# Patient Record
Sex: Female | Born: 1945 | Race: White | Hispanic: No | Smoking: Never smoker
Health system: Southern US, Community
[De-identification: ages and names within clinical notes are randomized; demographics above are authoritative.]

## PROBLEM LIST (undated history)

## (undated) DIAGNOSIS — I10 Essential (primary) hypertension: Secondary | ICD-10-CM

## (undated) DIAGNOSIS — E875 Hyperkalemia: Secondary | ICD-10-CM

## (undated) DIAGNOSIS — E0590 Thyrotoxicosis, unspecified without thyrotoxic crisis or storm: Secondary | ICD-10-CM

## (undated) DIAGNOSIS — K7689 Other specified diseases of liver: Secondary | ICD-10-CM

## (undated) HISTORY — PX: OTHER SURGICAL HISTORY: SHX169

## (undated) MED ORDER — HYDROCODONE-ACETAMINOPHEN 5 MG-325 MG TAB: 5-325 mg | ORAL_TABLET | ORAL | Status: AC | PRN

---

## 2012-04-27 ENCOUNTER — Inpatient Hospital Stay
Admit: 2012-04-27 | Discharge: 2012-05-05 | Disposition: A | Discharge status: Home / Self Care | Payer: MEDICARE | Attending: Surgical Oncology | Admitting: Surgical Oncology

## 2012-04-27 DIAGNOSIS — K7689 Other specified diseases of liver: Secondary | ICD-10-CM

## 2012-04-27 LAB — URINALYSIS W/ REFLEX CULTURE
Bilirubin: NEGATIVE
Glucose: NEGATIVE MG/DL
Ketone: NEGATIVE MG/DL
Nitrites: NEGATIVE
Protein: NEGATIVE MG/DL
Specific gravity: 1.018 (ref 1.003–1.030)
Urobilinogen: 4 EU/DL — ABNORMAL HIGH (ref 0.2–1.0)
pH (UA): 7 (ref 5.0–8.0)

## 2012-04-27 LAB — CBC WITH AUTOMATED DIFF
ABS. BASOPHILS: 0 10*3/uL (ref 0.0–0.1)
ABS. EOSINOPHILS: 0 10*3/uL (ref 0.0–0.4)
ABS. LYMPHOCYTES: 2.1 10*3/uL (ref 0.8–3.5)
ABS. MONOCYTES: 1.6 10*3/uL — ABNORMAL HIGH (ref 0.0–1.0)
ABS. NEUTROPHILS: 8.8 10*3/uL — ABNORMAL HIGH (ref 1.8–8.0)
BASOPHILS: 0 % (ref 0–1)
EOSINOPHILS: 0 % (ref 0–7)
HCT: 37.7 % (ref 35.0–47.0)
HGB: 12.2 g/dL (ref 11.5–16.0)
LYMPHOCYTES: 17 % (ref 12–49)
MCH: 26.6 PG (ref 26.0–34.0)
MCHC: 32.4 g/dL (ref 30.0–36.5)
MCV: 82.1 FL (ref 80.0–99.0)
MONOCYTES: 13 % (ref 5–13)
NEUTROPHILS: 70 % (ref 32–75)
PLATELET: 323 10*3/uL (ref 150–400)
RBC: 4.59 M/uL (ref 3.80–5.20)
RDW: 14 % (ref 11.5–14.5)
WBC: 12.5 10*3/uL — ABNORMAL HIGH (ref 3.6–11.0)

## 2012-04-27 LAB — METABOLIC PANEL, COMPREHENSIVE
A-G Ratio: 0.7 — ABNORMAL LOW (ref 1.1–2.2)
ALT (SGPT): 36 U/L (ref 12–78)
AST (SGOT): 24 U/L (ref 15–37)
Albumin: 3.5 g/dL (ref 3.5–5.0)
Alk. phosphatase: 143 U/L — ABNORMAL HIGH (ref 50–136)
Anion gap: 7 mmol/L (ref 5–15)
BUN/Creatinine ratio: 28 — ABNORMAL HIGH (ref 12–20)
BUN: 20 MG/DL (ref 6–20)
Bilirubin, total: 1 MG/DL (ref 0.2–1.0)
CO2: 27 MMOL/L (ref 21–32)
Calcium: 9.3 MG/DL (ref 8.5–10.1)
Chloride: 102 MMOL/L (ref 97–108)
Creatinine: 0.71 MG/DL (ref 0.45–1.15)
GFR est AA: >60 mL/min/{1.73_m2} (ref >60)
GFR est non-AA: >60 mL/min/{1.73_m2} (ref >60)
Globulin: 5.1 g/dL — ABNORMAL HIGH (ref 2.0–4.0)
Glucose: 131 MG/DL — ABNORMAL HIGH (ref 65–100)
Potassium: 3.6 MMOL/L (ref 3.5–5.1)
Protein, total: 8.6 g/dL — ABNORMAL HIGH (ref 6.4–8.2)
Sodium: 136 MMOL/L (ref 136–145)

## 2012-04-27 LAB — TROPONIN I: Troponin-I, Qt.: <0.04 ng/mL (ref <0.05)

## 2012-04-27 LAB — LIPASE: Lipase: 82 U/L (ref 73–393)

## 2012-04-27 LAB — CK W/ REFLX CKMB: CK: 34 U/L (ref 26–192)

## 2012-04-27 MED ADMIN — ondansetron (ZOFRAN) injection 4 mg: INTRAVENOUS | @ 21:00:00 | NDC 00781301072

## 2012-04-27 MED ADMIN — sodium chloride (NS) flush 10 mL: INTRAVENOUS | @ 18:00:00 | NDC 87701099893

## 2012-04-27 MED ADMIN — ondansetron (ZOFRAN) injection 4 mg: INTRAVENOUS | @ 16:00:00 | NDC 00781301072

## 2012-04-27 MED ADMIN — HYDROmorphone (PF) (DILAUDID) injection 1 mg: INTRAVENOUS | @ 17:00:00 | NDC 00409255201

## 2012-04-27 MED ADMIN — HYDROmorphone (PF) (DILAUDID) injection 1 mg: INTRAVENOUS | @ 21:00:00 | NDC 00409255201

## 2012-04-27 MED ADMIN — sodium chloride 0.9 % bolus infusion 1,000 mL: INTRAVENOUS | @ 16:00:00 | NDC 00409798309

## 2012-04-27 MED ADMIN — ioversol (OPTIRAY) 350 mg iodine/mL contrast solution 100 mL: INTRAVENOUS | @ 18:00:00 | NDC 00019133311

## 2012-04-27 MED ADMIN — sodium chloride 0.9 % bolus infusion 100 mL: INTRAVENOUS | @ 18:00:00 | NDC 00409798437

## 2012-04-27 MED FILL — ONDANSETRON (PF) 4 MG/2 ML INJECTION: 4 mg/2 mL | INTRAMUSCULAR | Qty: 2

## 2012-04-27 MED FILL — HYDROMORPHONE (PF) 1 MG/ML IJ SOLN: 1 mg/mL | INTRAMUSCULAR | Qty: 1

## 2012-04-27 MED FILL — BD POSIFLUSH NORMAL SALINE 0.9 % INJECTION SYRINGE: INTRAMUSCULAR | Qty: 10

## 2012-04-27 MED FILL — OPTIRAY 350 MG IODINE/ML INTRAVENOUS SOLUTION: 350 mg iodine/mL | INTRAVENOUS | Qty: 100

## 2012-04-27 MED FILL — SODIUM CHLORIDE 0.9 % IV: INTRAVENOUS | Qty: 100

## 2012-04-27 MED FILL — SODIUM CHLORIDE 0.9 % IV: INTRAVENOUS | Qty: 1000

## 2012-04-27 NOTE — ED Notes (Signed)
Patient in room with spouse. Complaining of pain in chest and abdomen. Xray here to pick up patient for CXR.

## 2012-04-27 NOTE — ED Provider Notes (Addendum)
HPI Comments: 66 y.o.female with pmh of HTN presents to the ED complaining of left sided chest, epigastric, and RUQ pain with nausea.  Pt states symptom onset began four days ago that is mild when she is laying still but increases in intensity with movement and food intake.  The pain doesn't radiate into her back and describes it as a dull with intermittent shooting pains, currently rating it 3/10 intensity.  She admits she has had decreased appetite and fluid intake since symptom onset with 2-3 episodes of vomiting yesterday after eating rice.  This morning she was able to eat dry toast without complications.  She hasn't had a BM in four days and denies any prior abdominal surgeries, fever, headache,current nausea, lower abdominal pain, leg pain, dysuria, increased urinary frequency, or any other acute medical problems.    Social hx: Non-smoker, no ETOH    Note written by Sherrye Payor, Scribe, as dictated by Shirl Harris, MD 11:54 AM            The history is provided by the patient.        Past Medical History   Diagnosis Date   ??? Hypertension         Past Surgical History   Procedure Laterality Date   ??? Hx gyn       Hysteroscopy, fibroid removal         History reviewed. No pertinent family history.     History     Social History   ??? Marital Status: MARRIED     Spouse Name: N/A     Number of Children: N/A   ??? Years of Education: N/A     Occupational History   ??? Not on file.     Social History Main Topics   ??? Smoking status: Never Smoker    ??? Smokeless tobacco: Not on file   ??? Alcohol Use: No   ??? Drug Use: No   ??? Sexually Active: Not on file     Other Topics Concern   ??? Not on file     Social History Narrative   ??? No narrative on file                  ALLERGIES: Review of patient's allergies indicates no known allergies.      Review of Systems   Constitutional: Positive for appetite change (Decreased). Negative for fever.   HENT: Negative for neck stiffness.    Eyes: Negative for visual disturbance.    Respiratory: Negative for cough, shortness of breath and wheezing.    Cardiovascular: Positive for chest pain. Negative for leg swelling.   Gastrointestinal: Positive for nausea, vomiting and abdominal pain. Negative for diarrhea.   Genitourinary: Negative for dysuria.   Musculoskeletal: Negative.  Negative for back pain.   Skin: Negative for rash.   Neurological: Negative.  Negative for syncope and headaches.   Psychiatric/Behavioral: Negative for confusion.   All other systems reviewed and are negative.        Filed Vitals:    04/27/12 1118 04/27/12 1233 04/27/12 1245   BP: 115/73  117/50   Pulse: 93  77   Temp: 98.7 ??F (37.1 ??C)     Resp: 16 19 12    Height: 5' 4.5" (1.638 m)     Weight: 90.719 kg (200 lb)     SpO2: 95% 97% 93%            Physical Exam   Nursing note and vitals reviewed.  Constitutional: She is oriented to person, place, and time. She appears well-developed and well-nourished. No distress.   HENT:   Head: Normocephalic and atraumatic.   Eyes: Conjunctivae are normal. No scleral icterus.   Neck: Neck supple. No tracheal deviation present.   Cardiovascular: Normal rate, regular rhythm, normal heart sounds and intact distal pulses.  Exam reveals no gallop and no friction rub.    No murmur heard.  Pulmonary/Chest: Effort normal and breath sounds normal. She has no wheezes. She has no rales.   Abdominal: Soft. She exhibits no distension. There is tenderness. There is no rebound and no guarding.   Tenderness over the epigastrium and RUQ with postive Murphy's sign.  No lower abdominal pain.     Musculoskeletal: Normal range of motion. She exhibits no edema and no tenderness.   Neurological: She is alert and oriented to person, place, and time.   Skin: Skin is warm and dry. No rash noted.   Psychiatric: She has a normal mood and affect. Her behavior is normal.        MDM     Differential Diagnosis; Clinical Impression; Plan:     66 year old white female presents to the emergency department with right  upper quadrant abdominal pain, epigastric abdominal pain, vomiting. Pain has been present for 3 or 4 days. Pain is worse with eating. Patient is tender in her epigastrium and right upper quadrant. I suspect either gastritis or gallbladder disease. Will check blood work, ultrasound of the gallbladder. We'll give IV fluids, Zofran. We'll reassess shortly.  Amount and/or Complexity of Data Reviewed:   Clinical lab tests:  Ordered and reviewed  Tests in the radiology section of CPT??:  Ordered and reviewed  Tests in the medicine section of the CPT??:  Ordered and reviewed   Decide to obtain previous medical records or to obtain history from someone other than the patient:  Yes   Obtain history from someone other than the patient:  Yes   Review and summarize past medical records:  Yes   Independant visualization of image, tracing, or specimen:  Yes  Risk of Significant Complications, Morbidity, and/or Mortality:   Presenting problems:  High  Diagnostic procedures:  High  Management options:  High  Progress:   Patient progress:  Improved      Procedures    ED EKG interpretation:  Rhythm: normal sinus rhythm; and regular . Rate (approx.): 88; Axis: left axis deviation; P wave: normal; QRS interval: normal ; ST/T wave: normal; Other findings: left ventricular hypertrophy. This EKG was interpreted by Clarene Reamer Provider.  Note written by Shirl Harris, MD, Scribe, as dictated by Shirl Harris, MD 11:54 AM      Labs:  WBC slightly elevated at 12.4  UA negative  LFTs and lipase negative    1:44 PM  [place] tech states that pt has heterogeneous appearance to liver with cystic structure. Will order CT abd/pelvis to evaluate.  Shirl Harris, MD    1500   Care turned over to Dr Mayford Knife. CT abd pending. Disposition based on CT abd results. It appears on [place] of abd that pt has large cystic liver mass. I suspect pt will need admission.    CONSULT NOTE:  Arbutus Ped, MD spoke to Philipp Ovens, MD, general surgery, concerning the  patient. The patient's history, presentation, physical findings, and results were all discussed.   Note written by Mariella Saa, Scribe, as dictated by Arbutus Ped, MD 5:40 PM  5:51 PM this patient still has significant pain in the right upper quadrant.  The painiHas been progressive in a crescendo fashion of the last 4 days. I will admit this patient to the hospital this and Dr. Noel Gerold will see this patient in the a.m.

## 2012-04-27 NOTE — H&P (Signed)
Name:       Sandra Walsh, Sandra Walsh                  Admitted:    04/27/2012    Account #:  [id number]                     DOB:         1946/01/12  Physician:  Corene Cornea, MD          Age:         66                               HISTORY AND PHYSICAL          PRIMARY CARE PHYSICIAN: Kathe Becton, MD.    SOURCE OF INFORMATION: The patient and the patient's spouse who was present  at the bedside.    CHIEF COMPLAINT: Chest pain.    HISTORY OF PRESENT ILLNESS: This is a 66 year old woman with a past medical  history significant for hypertension, hyperthyroidism, who was in her usual  state of health until a couple of days ago when the patient started  experiencing abdominal pain.  The pain is located at the epigastric area  with radiation to both the left flank as well as the right flank.  It was  associated with nausea and decreased oral intake. The patient also stated  that the pain radiates to her back. She came to the emergency room today  because of her worsening symptoms. When the patient arrived in the  emergency room, she had a CT scan of the abdomen done as well as the  abdominal ultrasound that shows a hepatic cyst. The emergency room  physician discussed the patient with the general surgeon on-call, who  advised that the patient be admitted to the hospitalist service and a  formal surgical consultation be carried out the following morning. The  patient was referred to the hospitalist service for that purpose. The  patient has no fever, no history of recent travel outside the country.    PAST MEDICAL HISTORY: Hypertension, hypothyroidism.    ALLERGIES: NO KNOWN DRUG ALLERGIES.    MEDICATIONS  1. Atenolol 25 mg daily.  2. Lisinopril/hydrochlorothiazide 20/25 one tablet daily.  3. Tapazole 5 mg daily.    FAMILY HISTORY: Family history was reviewed with the patient. No family  history of liver disease.    PAST SURGICAL HISTORY: This is significant for gynecological surgery.    SOCIAL HISTORY: No history of  alcohol or tobacco abuse.    REVIEW OF SYSTEMS  HEAD, EYES, EARS, NOSE AND THROAT: No headache, no dizziness, no blurring  of vision, no photophobia.  RESPIRATORY: No cough, no shortness of breath, no hemoptysis.  CARDIOVASCULAR: This is positive for chest pain, no orthopnea, no  palpitation.  GASTROINTESTINAL: This is positive for nausea and vomiting and abdominal  pain. No diarrhea, no constipation.  GENITOURINARY: No dysuria, no urgency, and no frequency. All other systems  are reviewed and they are negative.    PHYSICAL EXAMINATION  GENERAL APPEARANCE: The patient appeared ill, in moderate distress.  VITAL SIGNS: On arrival at the emergency room, temperature 98.7, pulse 93,  respiratory rate 16. Blood pressure 115/73, oxygen saturation 95% on room  air. HEAD, EYES, EARS, NOSE AND THROAT:  Normocephalic, atraumatic.  NECK: Supple. No JVD. No carotid bruits. Pupils equal and reactive to  light. No neck masses.  CHEST: Clear breath sounds. No wheezing, no crackles.  HEART: Normal S1 and S2, regular. No clinically appreciable murmur.  ABDOMEN: Diffuse tenderness, no rebound tenderness, no guarding. Normal  bowel sounds.  CENTRAL NERVOUS SYSTEM:  Alert, oriented x3. No gross focal neurological  deficits.  EXTREMITIES: No edema. Pulses 2+ bilaterally.    DIAGNOSTIC DATA: CT scan of the abdomen and pelvis with contrast shows  hepatic cyst, nonobstructing bilateral nephrolithiasis.  Abdominal  ultrasound shows a contracted gallbladder. No evidence of acute  cholecystitis.  Hepatic cyst containing several septations noted.  Mild  left hydronephrosis. Chest x-ray shows trace bilateral pleural effusions  and bibasilar linear atelectasis.    LABORATORY DATA: Urinalysis:  This is significant for negative nitrite,  large leukocyte esterase, 1+ bacteria. Lipase level 82.  CHEMISTRY: Sodium 136, potassium 3.6, chloride 102, CO2 27, glucose 131,  BUN 20, creatinine 0.71, calcium 9.3, bilirubin total 1.0, ALT 36, AST 24,   alkaline phosphatase 143, total protein 8.6, albumin level 3.5, globulin  5.1. Cardiac profile: CK 34, troponin less than 0.04. Hematology: WBC 12.5,  hemoglobin 12.2, hematocrit 37.7, platelets at 323.    ASSESSMENT  1. Hepatic cyst.  2. Hypertension.  3. Hypothyroidism.  4. Urinary tract infection.  5. Hyperglycemia.  6. Leukocytosis.  7. Abnormal chest x-ray.    PLAN  1. Hepatic cysts.  Will admit the patient for further evaluation. General  surgery consult has already been requested. We will await further  recommendations from the general surgeon.  We will obtain an ESR and  C-reactive protein level.  2. Hypertension. We will resume her preadmission antihypertensive  medication and monitor the patient's blood pressure closely.  3. Hypothyroidism. We will continue with Synthroid and check a TSH level.  4. Urinary tract infection. We will start the patient on Levaquin. I will  obtain a urine culture.  I will check an ESR and C-reactive protein level.  I will also obtain a lactic acid level.  5. Hyperglycemia.  The patient has no history of diabetes. Will check  hemoglobin A1c level.  6. Leukocytosis. This could be due to the urinary tract infection.  As  stated above, will obtain a lactic acid level, ESR, and C-reactive protein  level.  7. Abnormal chest x-ray. I will obtain a CT scan of the chest for further  evaluation of the abnormal chest x-ray.    OTHER ISSUES    CODE STATUS: THE PATIENT IS A FULL CODE. WE WILL PLACE THE PATIENT ON  LOVENOX FOR DVT PROPHYLAXIS.                Corene Cornea, MD    cc:                       Orma Flaming, MD                            Corene Cornea, MD      RAE/wmx; D: 04/27/2012 07:56 P; T: 04/27/2012 08:37 P; DOC# [id number]; Job#  [id number]

## 2012-04-27 NOTE — Progress Notes (Signed)
Admission Medication Reconciliation:    Information obtained from: patient    Significant PMH/Disease States:   Past Medical History   Diagnosis Date   ??? Hypertension        Chief Complaint for this Admission:  Chest pain/nausea    Allergies:  Review of patient's allergies indicates no known allergies.    Prior to Admission Medications:   Prior to Admission Medications   Medication Last Dose Informant Patient Reported? Taking?   methimazole (TAPAZOLE) 5 mg tablet 04/26/2012 at Unknown  Yes Yes   Take 5 mg by mouth nightly.   atenolol (TENORMIN) 25 mg tablet 04/26/2012 at Unknown  Yes Yes   Take 25 mg by mouth nightly.   lisinopril-hydrochlorothiazide (PRINZIDE, ZESTORETIC) 20-25 mg per tablet 04/27/2012 at Unknown  Yes Yes   Take  by mouth daily.          Comments/Recommendations: Changed timing of atenolol and methimazole to hs per patient report

## 2012-04-27 NOTE — H&P (Signed)
H&P dictated ZOX#[id number]

## 2012-04-27 NOTE — ED Notes (Signed)
Patient lying in bed with daughter at bedside. Patient states her pain has been under control and her chest/abdomen only really hurt when she moves or breathes deeply.

## 2012-04-27 NOTE — ED Notes (Signed)
Pt. C/o chest pain 6/10 that radiates to left upper abdominal area. Has been going on for past four days. Patient states that before today she has not been able to keep any food down because of resulting nausea and vomiting from the pain. Today she had toast that she tolerated well.

## 2012-04-27 NOTE — ED Notes (Addendum)
Student was appropriately supervised during medication administration by primary Registered Nurse.    MELISSA A PHILLIPS, RN

## 2012-04-27 NOTE — ED Notes (Signed)
Student was appropriately supervised during medication administration by primary Registered Nurse.    MELISSA A PHILLIPS, RN

## 2012-04-27 NOTE — ED Notes (Signed)
Patient states that she "doesn't feel any pain at all right now". Ambulated safely to and from restroom.

## 2012-04-27 NOTE — ED Notes (Signed)
"  I have chest pain and nausea. I threw up yesterday, I can't eat anything. I had some dry toast this morning. It's just a dull pain in my chest and sometimes it's shooting. I break out in a sweat and have chills once in a while." Patient has hx of irregular heartbeat but denies any other cardiac hx.

## 2012-04-28 LAB — METABOLIC PANEL, COMPREHENSIVE
A-G Ratio: 0.6 — ABNORMAL LOW (ref 1.1–2.2)
ALT (SGPT): 29 U/L (ref 12–78)
AST (SGOT): 33 U/L (ref 15–37)
Albumin: 2.7 g/dL — ABNORMAL LOW (ref 3.5–5.0)
Alk. phosphatase: 125 U/L (ref 50–136)
Anion gap: 9 mmol/L (ref 5–15)
BUN/Creatinine ratio: 43 — ABNORMAL HIGH (ref 12–20)
BUN: 20 MG/DL (ref 6–20)
Bilirubin, total: 0.9 MG/DL (ref 0.2–1.0)
CO2: 27 MMOL/L (ref 21–32)
Calcium: 8.7 MG/DL (ref 8.5–10.1)
Chloride: 103 MMOL/L (ref 97–108)
Creatinine: 0.47 MG/DL (ref 0.45–1.15)
GFR est AA: >60 mL/min/{1.73_m2} (ref >60)
GFR est non-AA: >60 mL/min/{1.73_m2} (ref >60)
Globulin: 4.5 g/dL — ABNORMAL HIGH (ref 2.0–4.0)
Glucose: 97 MG/DL (ref 65–100)
Potassium: 4.1 MMOL/L (ref 3.5–5.1)
Protein, total: 7.2 g/dL (ref 6.4–8.2)
Sodium: 139 MMOL/L (ref 136–145)

## 2012-04-28 LAB — EKG, 12 LEAD, INITIAL
Atrial Rate: 88 {beats}/min
Calculated P Axis: 5 degrees
Calculated R Axis: -23 degrees
Calculated T Axis: 56 degrees
Diagnosis: NORMAL
P-R Interval: 144 ms
Q-T Interval: 350 ms
QRS Duration: 86 ms
QTC Calculation (Bezet): 423 ms
Ventricular Rate: 88 {beats}/min

## 2012-04-28 LAB — CK W/ CKMB & INDEX
CK - MB: <0.5 NG/ML — ABNORMAL LOW (ref 0.5–3.6)
CK - MB: <0.5 NG/ML — ABNORMAL LOW (ref 0.5–3.6)
CK: 24 U/L — ABNORMAL LOW (ref 26–192)
CK: 71 U/L (ref 26–192)

## 2012-04-28 LAB — CBC WITH AUTOMATED DIFF
ABS. BASOPHILS: 0 10*3/uL (ref 0.0–0.1)
ABS. EOSINOPHILS: 0 10*3/uL (ref 0.0–0.4)
ABS. LYMPHOCYTES: 1.8 10*3/uL (ref 0.8–3.5)
ABS. MONOCYTES: 1.3 10*3/uL — ABNORMAL HIGH (ref 0.0–1.0)
ABS. NEUTROPHILS: 5.8 10*3/uL (ref 1.8–8.0)
BASOPHILS: 0 % (ref 0–1)
EOSINOPHILS: 0 % (ref 0–7)
HCT: 31.7 % — ABNORMAL LOW (ref 35.0–47.0)
HGB: 10.2 g/dL — ABNORMAL LOW (ref 11.5–16.0)
LYMPHOCYTES: 20 % (ref 12–49)
MCH: 26.4 PG (ref 26.0–34.0)
MCHC: 32.2 g/dL (ref 30.0–36.5)
MCV: 82.1 FL (ref 80.0–99.0)
MONOCYTES: 15 % — ABNORMAL HIGH (ref 5–13)
NEUTROPHILS: 65 % (ref 32–75)
PLATELET: 271 10*3/uL (ref 150–400)
RBC: 3.86 M/uL (ref 3.80–5.20)
RDW: 14.1 % (ref 11.5–14.5)
WBC: 8.9 10*3/uL (ref 3.6–11.0)

## 2012-04-28 LAB — BNP: BNP: 30 pg/mL (ref 0–100)

## 2012-04-28 LAB — SED RATE (ESR): Sed rate (ESR): 85 MM/HR — ABNORMAL HIGH (ref 0–30)

## 2012-04-28 LAB — MAGNESIUM: Magnesium: 1.7 MG/DL (ref 1.6–2.4)

## 2012-04-28 LAB — C REACTIVE PROTEIN, QT: C-Reactive protein: 17.3 mg/dL — ABNORMAL HIGH (ref 0.00–0.60)

## 2012-04-28 LAB — PHOSPHORUS: Phosphorus: 3.7 MG/DL (ref 2.5–4.9)

## 2012-04-28 LAB — TROPONIN I
Troponin-I, Qt.: <0.04 ng/mL (ref <0.05)
Troponin-I, Qt.: <0.04 ng/mL (ref <0.05)

## 2012-04-28 LAB — LACTIC ACID: Lactic acid: 0.9 MMOL/L (ref 0.4–2.0)

## 2012-04-28 LAB — HEMOGLOBIN A1C WITH EAG
Est. average glucose: 131 mg/dL
Hemoglobin A1c: 6.2 % (ref 4.2–6.3)

## 2012-04-28 LAB — TSH 3RD GENERATION: TSH: <0.01 u[IU]/mL — ABNORMAL LOW (ref 0.36–3.74)

## 2012-04-28 MED ADMIN — 0.9% sodium chloride infusion: INTRAVENOUS | @ 05:00:00 | NDC 00409798309

## 2012-04-28 MED ADMIN — 0.9% sodium chloride infusion: INTRAVENOUS | @ 02:00:00 | NDC 00409798309

## 2012-04-28 MED ADMIN — lactobac ac& pc-s.therm-b.anim (FLORA Q): ORAL | @ 20:00:00 | NDC 31257056030

## 2012-04-28 MED ADMIN — HYDROmorphone (PF) (DILAUDID) injection 1 mg: INTRAVENOUS | @ 11:00:00 | NDC 00409255201

## 2012-04-28 MED ADMIN — docusate sodium (COLACE) capsule 100 mg: ORAL | @ 02:00:00 | NDC 62584068311

## 2012-04-28 MED ADMIN — ondansetron (ZOFRAN) injection 4 mg: INTRAVENOUS | @ 16:00:00 | NDC 00409475503

## 2012-04-28 MED ADMIN — levofloxacin (LEVAQUIN) 500 mg in D5W IVPB: INTRAVENOUS | @ 02:00:00 | NDC 25021013282

## 2012-04-28 MED ADMIN — HYDROmorphone (PF) (DILAUDID) injection 1 mg: INTRAVENOUS | @ 16:00:00 | NDC 00409255201

## 2012-04-28 MED ADMIN — pantoprazole (PROTONIX) injection 40 mg: INTRAVENOUS | @ 13:00:00 | NDC 63323018610

## 2012-04-28 MED ADMIN — methimazole (TAPAZOLE) tablet 5 mg: ORAL | @ 02:00:00 | NDC 68084027511

## 2012-04-28 MED ADMIN — ondansetron (ZOFRAN) injection 4 mg: INTRAVENOUS | @ 12:00:00 | NDC 00781301072

## 2012-04-28 MED FILL — DOCUSATE SODIUM 100 MG CAP: 100 mg | ORAL | Qty: 1

## 2012-04-28 MED FILL — BD POSIFLUSH NORMAL SALINE 0.9 % INJECTION SYRINGE: INTRAMUSCULAR | Qty: 10

## 2012-04-28 MED FILL — BISACODYL 5 MG TAB, DELAYED RELEASE: 5 mg | ORAL | Qty: 1

## 2012-04-28 MED FILL — SODIUM CHLORIDE 0.9 % IV: INTRAVENOUS | Qty: 100

## 2012-04-28 MED FILL — HYDROCHLOROTHIAZIDE 25 MG TAB: 25 mg | ORAL | Qty: 1

## 2012-04-28 MED FILL — HYDROMORPHONE (PF) 1 MG/ML IJ SOLN: 1 mg/mL | INTRAMUSCULAR | Qty: 1

## 2012-04-28 MED FILL — LISINOPRIL 10 MG TAB: 10 mg | ORAL | Qty: 2

## 2012-04-28 MED FILL — RISAQUAD 8 BILLION CELL CAPSULE: 8 billion cell | ORAL | Qty: 1

## 2012-04-28 MED FILL — ATENOLOL 25 MG TAB: 25 mg | ORAL | Qty: 1

## 2012-04-28 MED FILL — PROTONIX 40 MG INTRAVENOUS SOLUTION: 40 mg | INTRAVENOUS | Qty: 40

## 2012-04-28 MED FILL — ONDANSETRON (PF) 4 MG/2 ML INJECTION: 4 mg/2 mL | INTRAMUSCULAR | Qty: 2

## 2012-04-28 MED FILL — SODIUM CHLORIDE 0.9 % IV: INTRAVENOUS | Qty: 1000

## 2012-04-28 MED FILL — OPTIRAY 350 MG IODINE/ML INTRAVENOUS SOLUTION: 350 mg iodine/mL | INTRAVENOUS | Qty: 100

## 2012-04-28 MED FILL — METHIMAZOLE 5 MG TAB: 5 mg | ORAL | Qty: 1

## 2012-04-28 MED FILL — ACIDOPHILUS-PECTIN CAPSULE: ORAL | Qty: 1

## 2012-04-28 MED FILL — LEVAQUIN 500 MG/100 ML IN 5% DEXTROSE INTRAVENOUS PIGGYBACK: 500 mg/100 mL | INTRAVENOUS | Qty: 100

## 2012-04-28 NOTE — Progress Notes (Signed)
Location: 4PTU - [address]  Attn.: Corene Cornea  DOB: [birth year] / Age: 67  MR#: [id number] / Admit#: [id number]  Pt. First Name: Sandra  Pt. Last Name: Sandra Walsh  [address]  [place], [place]  [address]                        Case Management - Progress Note  Initial Open Date:  Case Manager:    Initial Open Date:  Social Worker:  Expected Date of Discharge:  Transferred From:  ECF Bed Held Until:  Bed Held By:  Power of Attorney:  POA/Guardian/Conservator Capacity:  Primary Caregiver:  Living Arrangements:  Source of Income:  Payee:  Psychosocial History:  Cultural/Religious/Language Issues:  Education Level:  ADLS/Current Living Arrangements Issues:  Past Providers:  Will patient perform self care at discharge?  Anticipated Discharge Disposition Goal:  Assessment/Plan:      04/28/2012 02:02P  Chart reviewed for med necessity.  SB  Jannette Fogo, RN CRM  Resources at Discharge:  Service Providers at Discharge:  Dictating Provider:  Sinda Du

## 2012-04-28 NOTE — Progress Notes (Addendum)
General Surgery In-Patient Consultation    Admit Date: 04/27/2012  Reason for Consultation: abdominal pain, hepatic cyst    HPI:  Sandra Walsh is a 66 y.o. female whom we are asked to see in consultation for the above complaint.  She started having abdominal pain about 5-6 days ago. She had some accompanied nausea and she had no appetite. She denies diarrhea. She did have fevers, chills, and night sweats. She had contacted her family physician who advised her to come to the ER to be evaluated.       Patient Active Problem List    Diagnosis Date Noted   ??? Liver cyst 04/27/2012   ??? HTN (hypertension) 04/27/2012   ??? Hyperthyroidism 04/27/2012   ??? UTI (lower urinary tract infection) 04/27/2012     Past Medical History   Diagnosis Date   ??? Hypertension       Past Surgical History   Procedure Laterality Date   ??? Hx gyn       Hysteroscopy, fibroid removal      History   Substance Use Topics   ??? Smoking status: Never Smoker    ??? Smokeless tobacco: Not on file   ??? Alcohol Use: No      History reviewed. No pertinent family history.   Prior to Admission medications    Medication Sig Start Date End Date Taking? Authorizing Provider   methimazole (TAPAZOLE) 5 mg tablet Take 5 mg by mouth nightly.   Yes Phys Other, MD   atenolol (TENORMIN) 25 mg tablet Take 25 mg by mouth nightly.   Yes Phys Other, MD   lisinopril-hydrochlorothiazide (PRINZIDE, ZESTORETIC) 20-25 mg per tablet Take  by mouth daily.   Yes Phys Other, MD     Current Facility-Administered Medications   Medication Dose Route Frequency   ??? sodium chloride 0.9 % bolus infusion 100 mL  100 mL IntraVENous RAD ONCE   ??? ioversol (OPTIRAY) 350 mg iodine/mL contrast solution 100 mL  100 mL IntraVENous RAD ONCE   ??? sodium chloride (NS) flush 10 mL  10 mL IntraVENous RAD ONCE   ??? pantoprazole (PROTONIX) injection 40 mg  40 mg IntraVENous DAILY   ??? lactobac ac& pc-s.therm-b.anim (Vyla Q)  1 Cap Oral DAILY   ??? [COMPLETED] sodium chloride 0.9 % bolus infusion 100 mL  100 mL  IntraVENous RAD ONCE   ??? [COMPLETED] ioversol (OPTIRAY) 350 mg iodine/mL contrast solution 100 mL  100 mL IntraVENous RAD ONCE   ??? [COMPLETED] sodium chloride (NS) flush 10 mL  10 mL IntraVENous RAD ONCE   ??? [COMPLETED] HYDROmorphone (PF) (DILAUDID) injection 1 mg  1 mg IntraVENous NOW   ??? [COMPLETED] ondansetron (ZOFRAN) injection 4 mg  4 mg IntraVENous NOW   ??? atenolol (TENORMIN) tablet 25 mg  25 mg Oral QHS   ??? methimazole (TAPAZOLE) tablet 5 mg  5 mg Oral QHS   ??? 0.9% sodium chloride infusion  75 mL/hr IntraVENous CONTINUOUS   ??? HYDROmorphone (PF) (DILAUDID) injection 1 mg  1 mg IntraVENous Q4H PRN   ??? ondansetron (ZOFRAN) injection 4 mg  4 mg IntraVENous Q4H PRN   ??? bisacodyl (DULCOLAX) suppository 10 mg  10 mg Rectal DAILY PRN   ??? bisacodyl (DULCOLAX) tablet 5 mg  5 mg Oral DAILY PRN   ??? docusate sodium (COLACE) capsule 100 mg  100 mg Oral BID   ??? levofloxacin (LEVAQUIN) 500 mg in D5W IVPB  500 mg IntraVENous Q24H   ??? lisinopril (PRINIVIL, ZESTRIL) tablet 20 mg  20 mg Oral DAILY   ??? hydrochlorothiazide (HYDRODIURIL) tablet 25 mg  25 mg Oral DAILY     No Known Allergies       Subjective:     Review of Systems:    Constitutional: positive for fevers, chills and anorexia  Respiratory: negative for cough or sputum  Cardiovascular: negative for chest pain, chest pressure/discomfort, palpitations, syncope  Gastrointestinal: positive for nausea, vomiting and abdominal pain  Genitourinary:negative for frequency and dysuria  Integument/Breast: negative  Neurological: negative for headaches, dizziness and weakness       Objective:     Blood pressure 119/75, pulse 92, temperature 98.9 ??F (37.2 ??C), resp. rate 20, height 5' 4.5" (1.638 m), weight 90.719 kg (200 lb), SpO2 95.00%.  Temp (24hrs), Avg:99.1 ??F (37.3 ??C), Min:98.8 ??F (37.1 ??C), Max:99.4 ??F (37.4 ??C)      Recent Labs      04/28/12   0404  04/27/12   1153   WBC  8.9  12.5*   HGB  10.2*  12.2   HCT  31.7*  37.7   PLT  271  323     Recent Labs      04/28/12   0404   04/27/12   1153   NA  139  136   K  4.1  3.6   CL  103  102   CO2  27  27   GLU  97  131*   BUN  20  20   CREA  0.47  0.71   CA  8.7  9.3   MG  1.7   --    PHOS  3.7   --    ALB  2.7*  3.5   TBIL  0.9  1.0   SGOT  33  24     Recent Labs      04/27/12   1153   LPSE  82         Intake/Output Summary (Last 24 hours) at 04/28/12 1352  Last data filed at 04/28/12 0900   Gross per 24 hour   Intake      0 ml   Output    750 ml   Net   -750 ml     Date 04/28/12 0700 - 04/29/12 0659   Shift 1308-6578 1500-2259 2300-0659 24 Hour Total   I  N  T  A  K  E   Shift Total  (mL/kg)       O  U  T  P  U  T   Urine  (mL/kg/hr) 350   350    Shift Total  (mL/kg) 350  (3.9)   350  (3.9)   Weight (kg) 90.7 90.7 90.7 90.7     _____________________  Physical Exam:     General:  Alert, pleasant, cooperative, no distress, appears stated age.   Eyes:   PERRL, EOMs intact. Sclera clear.   Throat: Lips, mucosa, and tongue normal.   Neck: Supple, no carotid bruit, symmetrical, trachea midline.   Lungs:   Clear to auscultation bilaterally, even and unlabored, on room air.   Heart:  Regular rate and rhythm, no MRG.    Abdomen:   Abdomen soft, tender to palpation diffusely but more so in the RLQ and midline. Bowel sounds hypoactive.    Extremities: Extremities normal, atraumatic, no cyanosis or edema.   Skin: Skin color, texture, turgor normal. No rashes or lesions.     Abdominal CT: IMPRESSION:   1. Hepatic  cysts, as described above.   2. Nonobstructing bilateral nephrolithiasis            Assessment:   Principal Problem:    Liver cyst (04/27/2012)    Active Problems:    HTN (hypertension) (04/27/2012)      Hyperthyroidism (04/27/2012)      UTI (lower urinary tract infection) (04/27/2012)            Plan:     Large hepatic cyst - may need percutaneous drainage, will discuss with Dr. Jimmey Ralph  Will add Clear liquid diet  Continue protonix  Continue pain management      Thank you for allowing [place] to participate in the care of this patient.     Total  time spent with patient: 20 minutes.    Signed By: Kandice Hams     April 28, 2012

## 2012-04-28 NOTE — ED Notes (Signed)
Assumed care of the patient after receiving verbal and bedside report from [place], Consulting civil engineer. Pt sleeping, appears comfortable. Rhythmic respirations noted. Awaiting hospital bed to transfer the patient. Per charge nurse, A. Latouche, patient to be HOLD overnight in the Emergency Department.

## 2012-04-28 NOTE — ED Notes (Signed)
I was personally available in the emergency department, have reviewed the chart and agree with the documentation recorded by Casey Crone, including assessment and treatments.

## 2012-04-28 NOTE — Other (Signed)
TRANSFER - OUT REPORT:    Verbal report given to Nursing Student and Corrie Dandy, RN(name) on Sandra Walsh  being transferred to PTU(unit) for routine progression of care       Report consisted of patient???s Situation, Background, Assessment and   Recommendations(SBAR).     Information from the following report(s) SBAR, ED Summary and MAR was reviewed with the receiving nurse.    Opportunity for questions and clarification was provided.

## 2012-04-28 NOTE — ED Notes (Signed)
Pt ambulatory to restroom with steady gait, no assistance needed.

## 2012-04-28 NOTE — Progress Notes (Signed)
Hospitalist Progress Note         Sandra Simmer, MD  [facility] [phone]  Call physician on-call through the operator 7pm-7am    Daily Progress Note: 04/28/2012    Assessment/Plan:   1. Abdominal pain due to ? Hepatic cyst, CT of abdomen shows hepatic cyst nonobstructing bilateral nephrolithiasis, [place] of abdomen no evidence of acute cholecystitis, CTA negative for PE, surgery is consulted    2. Atypical chest pain, troponin <0.04 x2, check echo,  CTA no PE  3. HTN continue atenolol, hydrochlorothiazide, lisinopril and monitor BP  4. Hypethyroidism on methimazole   5. Pyuria on levaquin, follow up urine cx     GI prophylaxis protonix  DVT prophylaxis SCD  Disposition TBD           Subjective:   Complain epigastric pain, no shortness of breath     Review of Systems:   A comprehensive review of systems was negative except for that written in the HPI.    Objective:   Physical Exam:     BP 119/60   Pulse 87   Temp(Src) 99.1 ??F (37.3 ??C)   Resp 18   Ht 5' 4.5" (1.638 m)   Wt 200 lb   BMI 33.81 kg/m2   SpO2 93%   O2 Device: Room air    Temp (24hrs), Avg:99.1 ??F (37.3 ??C), Min:98.7 ??F (37.1 ??C), Max:99.4 ??F (37.4 ??C)        10/27 1900 - 10/29 0659  In: -   Out: 400 [Urine:400]    General:  Alert, cooperative, no distress, appears stated age.   Lungs:   Clear to auscultation bilaterally.   Chest wall:  No tenderness or deformity.   Heart:  Regular rate and rhythm, S1, S2 normal, no murmur, click, rub or gallop.   Abdomen:   Soft, non-tender. Bowel sounds normal. No masses,  No organomegaly.   Extremities: atraumatic, no cyanosis or edema.   Neurologic: Conscious and well oriented, CNII-XII grossly intact.      Data Review:       Recent Days:  Recent Labs      04/28/12   0404  04/27/12   1153   WBC  8.9  12.5*   HGB  10.2*  12.2   HCT  31.7*  37.7   PLT  271  323     Recent Labs      04/28/12   0404  04/27/12   1153   NA  139  136   K  4.1  3.6   CL  103  102   CO2  27  27   GLU  97  131*    BUN  20  20   CREA  0.47  0.71   CA  8.7  9.3   MG  1.7   --    PHOS  3.7   --    ALB  2.7*  3.5   TBIL  0.9  1.0   SGOT  33  24     No results found for this basename: PH, PCO2, PO2, HCO3, FIO2,  in the last 72 hours    24 Hour Results:  Recent Results (from the past 24 hour(s))   CBC WITH AUTOMATED DIFF    Collection Time    04/27/12 11:53 AM       Result Value Range    WBC 12.5 (*) 3.6 - 11.0 K/uL    RBC 4.59  3.80 - 5.20 M/uL  HGB 12.2  11.5 - 16.0 g/dL    HCT 16.1  09.6 - 04.5 %    MCV 82.1  80.0 - 99.0 FL    MCH 26.6  26.0 - 34.0 PG    MCHC 32.4  30.0 - 36.5 g/dL    RDW 40.9  81.1 - 91.4 %    PLATELET 323  150 - 400 K/uL    NEUTROPHILS 70  32 - 75 %    LYMPHOCYTES 17  12 - 49 %    MONOCYTES 13  5 - 13 %    EOSINOPHILS 0  0 - 7 %    BASOPHILS 0  0 - 1 %    ABS. NEUTROPHILS 8.8 (*) 1.8 - 8.0 K/UL    ABS. LYMPHOCYTES 2.1  0.8 - 3.5 K/UL    ABS. MONOCYTES 1.6 (*) 0.0 - 1.0 K/UL    ABS. EOSINOPHILS 0.0  0.0 - 0.4 K/UL    ABS. BASOPHILS 0.0  0.0 - 0.1 K/UL   METABOLIC PANEL, COMPREHENSIVE    Collection Time    04/27/12 11:53 AM       Result Value Range    Sodium 136  136 - 145 MMOL/L    Potassium 3.6  3.5 - 5.1 MMOL/L    Chloride 102  97 - 108 MMOL/L    CO2 27  21 - 32 MMOL/L    Anion gap 7  5 - 15 mmol/L    Glucose 131 (*) 65 - 100 MG/DL    BUN 20  6 - 20 MG/DL    Creatinine 7.82  9.56 - 1.15 MG/DL    BUN/Creatinine ratio 28 (*) 12 - 20      GFR est AA >60  >60 ml/min/1.58m2    GFR est non-AA >60  >60 ml/min/1.65m2    Calcium 9.3  8.5 - 10.1 MG/DL    Bilirubin, total 1.0  0.2 - 1.0 MG/DL    ALT 36  12 - 78 U/L    AST 24  15 - 37 U/L    Alk. phosphatase 143 (*) 50 - 136 U/L    Protein, total 8.6 (*) 6.4 - 8.2 g/dL    Albumin 3.5  3.5 - 5.0 g/dL    Globulin 5.1 (*) 2.0 - 4.0 g/dL    A-G Ratio 0.7 (*) 1.1 - 2.2     CK W/ REFLX CKMB    Collection Time    04/27/12 11:53 AM       Result Value Range    CK 34  26 - 192 U/L   TROPONIN I    Collection Time    04/27/12 11:53 AM       Result Value Range    Troponin-I, Qt.  <0.04  <0.05 ng/mL   LIPASE    Collection Time    04/27/12 11:53 AM       Result Value Range    Lipase 82  73 - 393 U/L   HEMOGLOBIN A1C    Collection Time    04/27/12 11:53 AM       Result Value Range    Hemoglobin A1c 6.2  4.2 - 6.3 %    Est. average glucose 131     URINALYSIS W/ REFLEX CULTURE    Collection Time    04/27/12  2:08 PM       Result Value Range    Color DARK YELLOW      Appearance CLOUDY (*) CLEAR    Specific gravity 1.018  1.003 - 1.030      pH 7.0  5.0 - 8.0      Protein NEGATIVE   NEGATIVE MG/DL    Glucose NEGATIVE   NEGATIVE MG/DL    Ketone NEGATIVE   NEGATIVE MG/DL    Bilirubin NEGATIVE   NEGATIVE    Blood SMALL (*) NEGATIVE    Urobilinogen 4.0 (*) 0.2 - 1.0 EU/DL    Nitrites NEGATIVE   NEGATIVE    Leukocyte Esterase LARGE (*) NEGATIVE    WBC 5-10  0 - 4 /HPF    RBC 0-5  0 - 5 /HPF    Epithelial cells FEW  FEW /LPF    Bacteria 1+ (*) NEGATIVE /HPF    UA:UC IF INDICATED URINE CULTURE ORDERED (*) CULTURE NOT INDICATE   TROPONIN I    Collection Time    04/27/12  9:39 PM       Result Value Range    Troponin-I, Qt. <0.04  <0.05 ng/mL   CK W/ CKMB & INDEX    Collection Time    04/27/12  9:39 PM       Result Value Range    CK - MB <0.5 (*) 0.5 - 3.6 NG/ML    CK-MB Index CANNOT BE CALCULATED  0 - 2.5    CK 24 (*) 26 - 192 U/L   BNP    Collection Time    04/27/12  9:39 PM       Result Value Range    BNP 30  0 - 100 pg/mL   LACTIC ACID, PLASMA    Collection Time    04/27/12  9:39 PM       Result Value Range    Lactic acid 0.9  0.4 - 2.0 MMOL/L   SED RATE (ESR)    Collection Time    04/27/12  9:39 PM       Result Value Range    Sed rate (ESR) 85 (*) 0 - 30 MM/HR   C REACTIVE PROTEIN, QT    Collection Time    04/27/12  9:39 PM       Result Value Range    C-Reactive protein 17.30 (*) 0.00 - 0.60 mg/dL   METABOLIC PANEL, COMPREHENSIVE    Collection Time    04/28/12  4:04 AM       Result Value Range    Sodium 139  136 - 145 MMOL/L    Potassium 4.1  3.5 - 5.1 MMOL/L    Chloride 103  97 - 108 MMOL/L    CO2 27   21 - 32 MMOL/L    Anion gap 9  5 - 15 mmol/L    Glucose 97  65 - 100 MG/DL    BUN 20  6 - 20 MG/DL    Creatinine 1.61  0.96 - 1.15 MG/DL    BUN/Creatinine ratio 43 (*) 12 - 20      GFR est AA >60  >60 ml/min/1.59m2    GFR est non-AA >60  >60 ml/min/1.62m2    Calcium 8.7  8.5 - 10.1 MG/DL    Bilirubin, total 0.9  0.2 - 1.0 MG/DL    ALT 29  12 - 78 U/L    AST 33  15 - 37 U/L    Alk. phosphatase 125  50 - 136 U/L    Protein, total 7.2  6.4 - 8.2 g/dL    Albumin 2.7 (*) 3.5 - 5.0 g/dL    Globulin 4.5 (*) 2.0 - 4.0 g/dL  A-G Ratio 0.6 (*) 1.1 - 2.2     CBC WITH AUTOMATED DIFF    Collection Time    04/28/12  4:04 AM       Result Value Range    WBC 8.9  3.6 - 11.0 K/uL    RBC 3.86  3.80 - 5.20 M/uL    HGB 10.2 (*) 11.5 - 16.0 g/dL    HCT 11.9 (*) 14.7 - 47.0 %    MCV 82.1  80.0 - 99.0 FL    MCH 26.4  26.0 - 34.0 PG    MCHC 32.2  30.0 - 36.5 g/dL    RDW 82.9  56.2 - 13.0 %    PLATELET 271  150 - 400 K/uL    NEUTROPHILS 65  32 - 75 %    LYMPHOCYTES 20  12 - 49 %    MONOCYTES 15 (*) 5 - 13 %    EOSINOPHILS 0  0 - 7 %    BASOPHILS 0  0 - 1 %    ABS. NEUTROPHILS 5.8  1.8 - 8.0 K/UL    ABS. LYMPHOCYTES 1.8  0.8 - 3.5 K/UL    ABS. MONOCYTES 1.3 (*) 0.0 - 1.0 K/UL    ABS. EOSINOPHILS 0.0  0.0 - 0.4 K/UL    ABS. BASOPHILS 0.0  0.0 - 0.1 K/UL   PHOSPHORUS    Collection Time    04/28/12  4:04 AM       Result Value Range    Phosphorus 3.7  2.5 - 4.9 MG/DL   TROPONIN I    Collection Time    04/28/12  4:04 AM       Result Value Range    Troponin-I, Qt. <0.04  <0.05 ng/mL   CK W/ CKMB & INDEX    Collection Time    04/28/12  4:04 AM       Result Value Range    CK - MB <0.5 (*) 0.5 - 3.6 NG/ML    CK-MB Index CANNOT BE CALCULATED  0 - 2.5    CK 71  26 - 192 U/L   TSH, 3RD GENERATION    Collection Time    04/28/12  4:04 AM       Result Value Range    TSH <0.01 (*) 0.36 - 3.74 uIU/mL   MAGNESIUM    Collection Time    04/28/12  4:04 AM       Result Value Range    Magnesium 1.7  1.6 - 2.4 MG/DL       Problem List:  Problem List as of  04/28/2012 Date Reviewed: 25-May-2012        Codes Class Noted - Resolved    *Liver cyst 573.8  05-25-12 - Present        HTN (hypertension) (Chronic) 401.9  05-25-12 - Present        Hyperthyroidism (Chronic) 242.90  25-May-2012 - Present        UTI (lower urinary tract infection) 599.0  05/25/12 - Present              Medications reviewed  Current Facility-Administered Medications   Medication Dose Route Frequency   ??? [COMPLETED] sodium chloride 0.9 % bolus infusion 1,000 mL  1,000 mL IntraVENous ONCE   ??? [COMPLETED] ondansetron (ZOFRAN) injection 4 mg  4 mg IntraVENous NOW   ??? [COMPLETED] HYDROmorphone (PF) (DILAUDID) injection 1 mg  1 mg IntraVENous ONCE   ??? [COMPLETED] sodium chloride 0.9 % bolus infusion 100 mL  100 mL IntraVENous RAD ONCE   ??? [COMPLETED] ioversol (OPTIRAY) 350 mg iodine/mL contrast solution 100 mL  100 mL IntraVENous RAD ONCE   ??? [COMPLETED] sodium chloride (NS) flush 10 mL  10 mL IntraVENous RAD ONCE   ??? [COMPLETED] HYDROmorphone (PF) (DILAUDID) injection 1 mg  1 mg IntraVENous NOW   ??? [COMPLETED] ondansetron (ZOFRAN) injection 4 mg  4 mg IntraVENous NOW   ??? atenolol (TENORMIN) tablet 25 mg  25 mg Oral QHS   ??? methimazole (TAPAZOLE) tablet 5 mg  5 mg Oral QHS   ??? 0.9% sodium chloride infusion  75 mL/hr IntraVENous CONTINUOUS   ??? HYDROmorphone (PF) (DILAUDID) injection 1 mg  1 mg IntraVENous Q4H PRN   ??? ondansetron (ZOFRAN) injection 4 mg  4 mg IntraVENous Q4H PRN   ??? bisacodyl (DULCOLAX) suppository 10 mg  10 mg Rectal DAILY PRN   ??? bisacodyl (DULCOLAX) tablet 5 mg  5 mg Oral DAILY PRN   ??? docusate sodium (COLACE) capsule 100 mg  100 mg Oral BID   ??? levofloxacin (LEVAQUIN) 500 mg in D5W IVPB  500 mg IntraVENous Q24H   ??? lactobacillus acidoph-pectin (ACIDOPHILUS-PECTIN) capsule 1 Cap  1 Cap Oral BID   ??? lisinopril (PRINIVIL, ZESTRIL) tablet 20 mg  20 mg Oral DAILY   ??? hydrochlorothiazide (HYDRODIURIL) tablet 25 mg  25 mg Oral DAILY     Current Outpatient Prescriptions   Medication Sig   ???  methimazole (TAPAZOLE) 5 mg tablet Take 5 mg by mouth nightly.   ??? atenolol (TENORMIN) 25 mg tablet Take 25 mg by mouth nightly.   ??? lisinopril-hydrochlorothiazide (PRINZIDE, ZESTORETIC) 20-25 mg per tablet Take  by mouth daily.       Care Plan discussed with: Patient/Family and Nurse    Total time spent with patient: 30 minutes.    Ladoris Gene, MD

## 2012-04-28 NOTE — ED Notes (Signed)
Per hospitalist patient may receive morning meds even though she is NPO.

## 2012-04-28 NOTE — ED Notes (Signed)
Bedside shift change report given to [facility], RN by myself.  Report given with SBAR, ED Summary, Intake/Output, MAR and Recent Results. A period of questions and answers was afforded.

## 2012-04-28 NOTE — ED Notes (Cosign Needed)
Bedside and Verbal shift change report given to Julie (oncoming nurse) by Casey Crone (offgoing nurse).  Report given with ED Summary.

## 2012-04-29 LAB — CULTURE, URINE
Colonies Counted: >100000
Colony Count: >100000

## 2012-04-29 LAB — CBC WITH AUTOMATED DIFF
ABS. BASOPHILS: 0 10*3/uL (ref 0.0–0.1)
ABS. EOSINOPHILS: 0 10*3/uL (ref 0.0–0.4)
ABS. LYMPHOCYTES: 1.7 10*3/uL (ref 0.8–3.5)
ABS. MONOCYTES: 1.1 10*3/uL — ABNORMAL HIGH (ref 0.0–1.0)
ABS. NEUTROPHILS: 5.4 10*3/uL (ref 1.8–8.0)
BASOPHILS: 0 % (ref 0–1)
EOSINOPHILS: 0 % (ref 0–7)
HCT: 28.9 % — ABNORMAL LOW (ref 35.0–47.0)
HGB: 9.4 g/dL — ABNORMAL LOW (ref 11.5–16.0)
LYMPHOCYTES: 20 % (ref 12–49)
MCH: 26.6 PG (ref 26.0–34.0)
MCHC: 32.5 g/dL (ref 30.0–36.5)
MCV: 81.6 FL (ref 80.0–99.0)
MONOCYTES: 13 % (ref 5–13)
NEUTROPHILS: 67 % (ref 32–75)
PLATELET: 260 10*3/uL (ref 150–400)
RBC: 3.54 M/uL — ABNORMAL LOW (ref 3.80–5.20)
RDW: 13.8 % (ref 11.5–14.5)
WBC: 8.2 10*3/uL (ref 3.6–11.0)

## 2012-04-29 LAB — METABOLIC PANEL, COMPREHENSIVE
A-G Ratio: 0.6 — ABNORMAL LOW (ref 1.1–2.2)
ALT (SGPT): 26 U/L (ref 12–78)
AST (SGOT): 21 U/L (ref 15–37)
Albumin: 2.7 g/dL — ABNORMAL LOW (ref 3.5–5.0)
Alk. phosphatase: 151 U/L — ABNORMAL HIGH (ref 50–136)
Anion gap: 8 mmol/L (ref 5–15)
BUN/Creatinine ratio: 33 — ABNORMAL HIGH (ref 12–20)
BUN: 19 MG/DL (ref 6–20)
Bilirubin, total: 0.9 MG/DL (ref 0.2–1.0)
CO2: 26 MMOL/L (ref 21–32)
Calcium: 8.9 MG/DL (ref 8.5–10.1)
Chloride: 106 MMOL/L (ref 97–108)
Creatinine: 0.57 MG/DL (ref 0.45–1.15)
GFR est AA: >60 mL/min/{1.73_m2} (ref >60)
GFR est non-AA: >60 mL/min/{1.73_m2} (ref >60)
Globulin: 4.5 g/dL — ABNORMAL HIGH (ref 2.0–4.0)
Glucose: 105 MG/DL — ABNORMAL HIGH (ref 65–100)
Potassium: 3.8 MMOL/L (ref 3.5–5.1)
Protein, total: 7.2 g/dL (ref 6.4–8.2)
Sodium: 140 MMOL/L (ref 136–145)

## 2012-04-29 MED ADMIN — sodium chloride (NS) 0.9 % flush: @ 22:00:00 | NDC 82903065462

## 2012-04-29 MED ADMIN — methimazole (TAPAZOLE) tablet 5 mg: ORAL | @ 02:00:00 | NDC 68084027511

## 2012-04-29 MED ADMIN — HYDROmorphone (PF) (DILAUDID) injection 0.5 mg: INTRAVENOUS | @ 02:00:00 | NDC 00409255201

## 2012-04-29 MED ADMIN — HYDROmorphone (PF) (DILAUDID) injection 1 mg: INTRAVENOUS | @ 22:00:00 | NDC 00409255201

## 2012-04-29 MED ADMIN — ondansetron (ZOFRAN) injection 4 mg: INTRAVENOUS | @ 22:00:00 | NDC 00143989105

## 2012-04-29 MED ADMIN — pantoprazole (PROTONIX) injection 40 mg: INTRAVENOUS | @ 14:00:00 | NDC 63323018610

## 2012-04-29 MED ADMIN — levofloxacin (LEVAQUIN) 500 mg in D5W IVPB: INTRAVENOUS | @ 02:00:00 | NDC 25021013282

## 2012-04-29 MED ADMIN — lactobac ac& pc-s.therm-b.anim (FLORA Q): ORAL | @ 14:00:00 | NDC 31257056030

## 2012-04-29 MED ADMIN — pneumococcal 23-valent (PNEUMOVAX 23) injection 0.5 mL: INTRAMUSCULAR | @ 02:00:00 | NDC 00006494300

## 2012-04-29 MED ADMIN — ondansetron (ZOFRAN) injection 4 mg: INTRAVENOUS | @ 01:00:00 | NDC 00409475503

## 2012-04-29 MED ADMIN — atenolol (TENORMIN) tablet 25 mg: ORAL | @ 02:00:00 | NDC 00904539261

## 2012-04-29 MED ADMIN — 0.9% sodium chloride infusion: INTRAVENOUS | NDC 00409798309

## 2012-04-29 MED ADMIN — sodium chloride (NS) 0.9 % flush: @ 15:00:00 | NDC 87701099893

## 2012-04-29 MED ADMIN — HYDROmorphone (PF) (DILAUDID) injection 1 mg: INTRAVENOUS | @ 01:00:00 | NDC 00409255201

## 2012-04-29 MED FILL — ONDANSETRON (PF) 4 MG/2 ML INJECTION: 4 mg/2 mL | INTRAMUSCULAR | Qty: 2

## 2012-04-29 MED FILL — DOCUSATE SODIUM 100 MG CAP: 100 mg | ORAL | Qty: 1

## 2012-04-29 MED FILL — HYDROMORPHONE (PF) 1 MG/ML IJ SOLN: 1 mg/mL | INTRAMUSCULAR | Qty: 1

## 2012-04-29 MED FILL — LISINOPRIL 20 MG TAB: 20 mg | ORAL | Qty: 1

## 2012-04-29 MED FILL — HYDROCHLOROTHIAZIDE 25 MG TAB: 25 mg | ORAL | Qty: 1

## 2012-04-29 MED FILL — METHIMAZOLE 5 MG TAB: 5 mg | ORAL | Qty: 1

## 2012-04-29 MED FILL — RISAQUAD 8 BILLION CELL CAPSULE: 8 billion cell | ORAL | Qty: 1

## 2012-04-29 MED FILL — LEVAQUIN 500 MG/100 ML IN 5% DEXTROSE INTRAVENOUS PIGGYBACK: 500 mg/100 mL | INTRAVENOUS | Qty: 100

## 2012-04-29 MED FILL — BD POSIFLUSH NORMAL SALINE 0.9 % INJECTION SYRINGE: INTRAMUSCULAR | Qty: 10

## 2012-04-29 MED FILL — PNEUMOVAX-23 25 MCG/0.5 ML INJECTION SOLUTION: 25 mcg/0.5 mL | INTRAMUSCULAR | Qty: 0.5

## 2012-04-29 MED FILL — PROTONIX 40 MG INTRAVENOUS SOLUTION: 40 mg | INTRAVENOUS | Qty: 40

## 2012-04-29 MED FILL — ATENOLOL 25 MG TAB: 25 mg | ORAL | Qty: 1

## 2012-04-29 NOTE — Progress Notes (Signed)
Please see the more detailed note from Cassandra A Lewis, the NP.  Patient was seen and examined.  I agree with NP's assessment and plan.

## 2012-04-29 NOTE — Progress Notes (Signed)
Met with pt following a consult from NP regarding pt/husband concerns/questions over coverage.  CM met with pt and explained role of cm, offered support.  She explained she just retired from [employer] on 10/18 and has Medicare retro to 10/01 and will now have a new managed plan starting 11/01.  She previously had [employer] but feels certain it termed 04/17/12.  Currently her insurance is medicare primary A & B.  Assisted them with located inpt hospitalization copay under medicare and provided care card application if needed.  Will follow.

## 2012-04-29 NOTE — Progress Notes (Signed)
Sandra Walsh, ACNP  [employer] number:   [phone]  After 5pm please call operator for physician on call    Hospitalist Progress Note     PCP: Moshe Cipro, MD    Assessment/Plan:   1. Abdominal pain - Suspect related to large hepatic cyst seen on CT A/P. [place] of abdomen without acute cholecystitis. Discussed with surgery and possibility of drainage. On clears for now and awaiting further plan per Dr. Jimmey Ralph  2. Atypical chest pain - Troponins neg x 2. ECHO ordered and pending. Suspect more so related to abdominal pain. CTA negative for PE  3. HTN - Well controlled. Continue Atonolol and HCTZ  4. Hyperthyroid - Continue Methimazole  5. Pyuria - Urine cx negative. Stop Zosyn    GI & DVT prophylaxis: Protonix  Dispo: Home   Family: Significant other updated at bedside 10/30       Subjective:   "I'm still in a lot of pain." No other issues overnight. Pain worse with deep breath and localized under right breast.  Discussed with Nursing, 10/30 and NP Berneice Gandy    Review of Systems:   Denies CP, no sob, no nausea, no leg pain    Objective:     Visit Vitals   Item Reading   ??? BP 109/51   ??? Pulse 71   ??? Temp 99.3 ??F (37.4 ??C)   ??? Resp 18   ??? Ht 5' 4.5" (1.638 m)   ??? Wt 189 lb 2.5 oz   ??? BMI 31.98 kg/m2   ??? SpO2 94%       Intake/Output Summary (Last 24 hours) at 04/29/12 1129  Last data filed at 04/29/12 0931   Gross per 24 hour   Intake    400 ml   Output      0 ml   Net    400 ml        PHYSICAL EXAM:   General:  Alert female in no acute distress  HEENT: Atraumatic; PERRL; oral pharynx moist   Neurologic:  Alert and oriented X 3. Following all commands  Lungs:  CTA Bilateral; No accessory muscle usage  Neck:   Supple, trachea midline   Heart:   Regular Rate and rhythm, No murmur, No Rubs, No Gallops, no JVD   Abdomen:  Soft, Non distended, Non tender. Hypoactive bowel sounds  Extremities:  No clubbing cyanosis, no  edema, warm, dry. +2 DP pulses B/L  Skin:  Intact  Psych:  cooperative. Not anxious nor agitated.      Data Review:     Recent Labs      04/29/12   0554  04/28/12   0404  04/27/12   1153   NA  140  139  136   K  3.8  4.1  3.6   CL  106  103  102   CO2  26  27  27    BUN  19  20  20    CREA  0.57  0.47  0.71   GLU  105*  97  131*   PHOS   --   3.7   --    MG   --   1.7   --    ALB  2.7*  2.7*  3.5     Recent Labs      04/29/12   0554  04/28/12   0404  04/27/12   1153   WBC  8.2  8.9  12.5*   HGB  9.4*  10.2*  12.2   HCT  28.9*  31.7*  37.7   PLT  260  271  323     Recent Labs      04/28/12   0404  04/27/12   2139  04/27/12   1153   CPK  71  24*   --    CPKMB  <0.5*  <0.5*   --    CKNDX  CANNOT BE CALCULATED  CANNOT BE CALCULATED   --    TROIQ  <0.04  <0.04  <0.04     No results found for this basename: INR, PTP, APTT,  in the last 72 hours  No results found for this basename: GLPOC       Current Facility-Administered Medications   Medication Dose Route Frequency   ??? sodium chloride (NS) 0.9 % flush       ??? [COMPLETED] pneumococcal 23-valent (PNEUMOVAX 23) injection 0.5 mL  0.5 mL IntraMUSCular ONCE   ??? [EXPIRED] sodium chloride 0.9 % bolus infusion 100 mL  100 mL IntraVENous RAD ONCE   ??? [EXPIRED] ioversol (OPTIRAY) 350 mg iodine/mL contrast solution 100 mL  100 mL IntraVENous RAD ONCE   ??? [EXPIRED] sodium chloride (NS) flush 10 mL  10 mL IntraVENous RAD ONCE   ??? pantoprazole (PROTONIX) injection 40 mg  40 mg IntraVENous DAILY   ??? lactobac ac& pc-s.therm-b.anim (Willine Q)  1 Cap Oral DAILY   ??? HYDROmorphone (PF) (DILAUDID) injection 1 mg  1 mg IntraVENous Q3H PRN   ??? [COMPLETED] HYDROmorphone (PF) (DILAUDID) injection 0.5 mg  0.5 mg IntraVENous ONCE   ??? atenolol (TENORMIN) tablet 25 mg  25 mg Oral QHS   ??? methimazole (TAPAZOLE) tablet 5 mg  5 mg Oral QHS   ??? 0.9% sodium chloride infusion  75 mL/hr IntraVENous CONTINUOUS   ??? ondansetron (ZOFRAN) injection 4 mg  4 mg IntraVENous Q4H PRN   ??? bisacodyl (DULCOLAX)  suppository 10 mg  10 mg Rectal DAILY PRN   ??? bisacodyl (DULCOLAX) tablet 5 mg  5 mg Oral DAILY PRN   ??? docusate sodium (COLACE) capsule 100 mg  100 mg Oral BID   ??? lisinopril (PRINIVIL, ZESTRIL) tablet 20 mg  20 mg Oral DAILY   ??? hydrochlorothiazide (HYDRODIURIL) tablet 25 mg  25 mg Oral DAILY       Total time spent with patient/care coordination: 25 minutes.    Azavier Creson A. Melvyn Neth, NP

## 2012-04-30 MED ADMIN — 0.9% sodium chloride infusion: INTRAVENOUS | @ 13:00:00 | NDC 00409798309

## 2012-04-30 MED ADMIN — HYDROmorphone (PF) (DILAUDID) injection 1 mg: INTRAVENOUS | @ 17:00:00 | NDC 00409255201

## 2012-04-30 MED ADMIN — lisinopril (PRINIVIL, ZESTRIL) tablet 20 mg: ORAL | @ 13:00:00 | NDC 68084006211

## 2012-04-30 MED ADMIN — sodium chloride (NS) 0.9 % flush: @ 17:00:00 | NDC 87701099893

## 2012-04-30 MED ADMIN — HYDROmorphone (PF) (DILAUDID) injection 1 mg: INTRAVENOUS | @ 11:00:00 | NDC 00409255201

## 2012-04-30 MED ADMIN — methimazole (TAPAZOLE) tablet 5 mg: ORAL | @ 01:00:00 | NDC 68084027511

## 2012-04-30 MED ADMIN — pantoprazole (PROTONIX) injection 40 mg: INTRAVENOUS | @ 13:00:00 | NDC 63323018610

## 2012-04-30 MED ADMIN — acetaminophen (TYLENOL) tablet 650 mg: ORAL | @ 23:00:00 | NDC 50580050130

## 2012-04-30 MED ADMIN — hydrochlorothiazide (HYDRODIURIL) tablet 25 mg: ORAL | @ 13:00:00 | NDC 68084008611

## 2012-04-30 MED ADMIN — lactobac ac& pc-s.therm-b.anim (FLORA Q): ORAL | @ 13:00:00 | NDC 31257056030

## 2012-04-30 MED ADMIN — atenolol (TENORMIN) tablet 25 mg: ORAL | @ 01:00:00 | NDC 00904539261

## 2012-04-30 MED FILL — METHIMAZOLE 5 MG TAB: 5 mg | ORAL | Qty: 1

## 2012-04-30 MED FILL — SODIUM CHLORIDE 0.9 % INJECTION: INTRAMUSCULAR | Qty: 10

## 2012-04-30 MED FILL — BD POSIFLUSH NORMAL SALINE 0.9 % INJECTION SYRINGE: INTRAMUSCULAR | Qty: 10

## 2012-04-30 MED FILL — ACETAMINOPHEN 325 MG TABLET: 325 mg | ORAL | Qty: 2

## 2012-04-30 MED FILL — HYDROCHLOROTHIAZIDE 25 MG TAB: 25 mg | ORAL | Qty: 1

## 2012-04-30 MED FILL — RISAQUAD 8 BILLION CELL CAPSULE: 8 billion cell | ORAL | Qty: 1

## 2012-04-30 MED FILL — DOCUSATE SODIUM 100 MG CAP: 100 mg | ORAL | Qty: 1

## 2012-04-30 MED FILL — HYDROMORPHONE (PF) 1 MG/ML IJ SOLN: 1 mg/mL | INTRAMUSCULAR | Qty: 1

## 2012-04-30 MED FILL — ATENOLOL 25 MG TAB: 25 mg | ORAL | Qty: 1

## 2012-04-30 MED FILL — LISINOPRIL 20 MG TAB: 20 mg | ORAL | Qty: 1

## 2012-04-30 NOTE — Progress Notes (Signed)
General Surgery Daily Progress Note    Admit Date: 04/27/2012    Subjective:     Last 24 hrs: Discussed care with hospitalist service -  work-up thus far has been negative and she continues to have intolerable pain over her epigastrium and across her RUQ.        Objective:     Blood pressure 119/73, pulse 68, temperature 98.8 ??F (37.1 ??C), resp. rate 18, height 5' 4.5" (1.638 m), weight 85.8 kg (189 lb 2.5 oz), SpO2 95.00%.  Temp (24hrs), Avg:99.6 ??F (37.6 ??C), Min:98.8 ??F (37.1 ??C), Max:100.2 ??F (37.9 ??C)      _____________________  Physical Exam:     Alert and Oriented, lying in bed, appears uncomfortable.   Cardiovascular: RRR  Lungs:CTAB   Abdomen: soft, quite tender over epigastrium and RUQ.  Normal BS      Assessment:   Principal Problem:    Liver cyst (04/27/2012)    Active Problems:    HTN (hypertension) (04/27/2012)      Hyperthyroidism (04/27/2012)      UTI (lower urinary tract infection) (04/27/2012)            Plan:     D/W Dr. Jimmey Ralph  Plan for OR tomorrow to unroof the hepatic cyst  May have regular diet until midnight, then NPO    Data Review:    Recent Labs      04/29/12   0554  04/28/12   0404  04/27/12   1153   WBC  8.2  8.9  12.5*   HGB  9.4*  10.2*  12.2   HCT  28.9*  31.7*  37.7   PLT  260  271  323     Recent Labs      04/29/12   0554  04/28/12   0404  04/27/12   1153   NA  140  139  136   K  3.8  4.1  3.6   CL  106  103  102   CO2  26  27  27    GLU  105*  97  131*   BUN  19  20  20    CREA  0.57  0.47  0.71   CA  8.9  8.7  9.3   MG   --   1.7   --    PHOS   --   3.7   --    ALB  2.7*  2.7*  3.5   TBIL  0.9  0.9  1.0   SGOT  21  33  24     Recent Labs      04/27/12   1153   LPSE  82           ______________________  Medications:    Current Facility-Administered Medications   Medication Dose Route Frequency   ??? [EXPIRED] sodium chloride (NS) 0.9 % flush       ??? [COMPLETED] sodium chloride (NS) 0.9 % flush       ??? pantoprazole (PROTONIX) injection 40 mg  40 mg IntraVENous DAILY   ??? lactobac ac&  pc-s.therm-b.anim ([place]alee Q)  1 Cap Oral DAILY   ??? HYDROmorphone (PF) (DILAUDID) injection 1 mg  1 mg IntraVENous Q3H PRN   ??? atenolol (TENORMIN) tablet 25 mg  25 mg Oral QHS   ??? methimazole (TAPAZOLE) tablet 5 mg  5 mg Oral QHS   ??? 0.9% sodium chloride infusion  75 mL/hr IntraVENous CONTINUOUS   ??? ondansetron (ZOFRAN) injection  4 mg  4 mg IntraVENous Q4H PRN   ??? bisacodyl (DULCOLAX) suppository 10 mg  10 mg Rectal DAILY PRN   ??? bisacodyl (DULCOLAX) tablet 5 mg  5 mg Oral DAILY PRN   ??? docusate sodium (COLACE) capsule 100 mg  100 mg Oral BID   ??? lisinopril (PRINIVIL, ZESTRIL) tablet 20 mg  20 mg Oral DAILY   ??? hydrochlorothiazide (HYDRODIURIL) tablet 25 mg  25 mg Oral DAILY

## 2012-04-30 NOTE — Progress Notes (Signed)
Discussed the procedure and she agrees completely withunfroofing the major cyst via laparoscopy.

## 2012-04-30 NOTE — Progress Notes (Signed)
Judd Gaudier, ACNP  [employer] number:   [phone]  After 5pm please call operator for physician on call    Hospitalist Progress Note     PCP: Moshe Cipro, MD    Assessment/Plan:   1. Abdominal pain - Suspect related to large hepatic cyst seen on CT A/P. [place] of abdomen without acute cholecystitis. Discussed with surgery and plan for drainage and unroofing tomorrow with Dr. Jimmey Ralph. Regular diet and NPO at midnight. Continue pain control  2. Atypical chest pain - Troponins neg x 2. ECHO ordered and pending. Suspect more so related to abdominal pain. CTA negative for PE  3. HTN - Well controlled. Continue Atonolol and HCTZ  4. Hyperthyroid - Continue Methimazole  5. Pyuria - Urine cx negative. Stop Zosyn    GI & DVT prophylaxis: Protonix  Dispo: Home post op  Family: Significant other updated at bedside 10/31       Subjective:   Still having a lot of pain. No nausea and appetite intact asking for more food.  Discussed with Nursing, 10/31  and NP Bary Richard    Review of Systems:   Denies CP, no sob, no nausea, no leg pain    Objective:     Visit Vitals   Item Reading   ??? BP 119/73   ??? Pulse 68   ??? Temp 98.8 ??F (37.1 ??C)   ??? Resp 18   ??? Ht 5' 4.5" (1.638 m)   ??? Wt 189 lb 2.5 oz   ??? BMI 31.98 kg/m2   ??? SpO2 95%       Intake/Output Summary (Last 24 hours) at 04/30/12 1012  Last data filed at 04/30/12 0617   Gross per 24 hour   Intake 4061.25 ml   Output    350 ml   Net 3711.25 ml        PHYSICAL EXAM:   General:  Alert female in no acute distress  HEENT: Atraumatic; PERRL; oral pharynx moist   Neurologic:  Alert and oriented X 3. Following all commands  Lungs:  CTA Bilateral; No accessory muscle usage  Heart:   Regular Rate and rhythm, No murmur, No Rubs, No Gallops, no JVD   Abdomen:  Soft, Non distended, Significant right upper quadrant tenderness. Hypoactive BS  Extremities:  No clubbing cyanosis, no edema, warm,  dry. +2 DP pulses B/L  Skin:  Intact  Psych:  cooperative. Not anxious nor agitated.      Data Review:     Recent Labs      04/29/12   0554  04/28/12   0404  04/27/12   1153   NA  140  139  136   K  3.8  4.1  3.6   CL  106  103  102   CO2  26  27  27    BUN  19  20  20    CREA  0.57  0.47  0.71   GLU  105*  97  131*   PHOS   --   3.7   --    MG   --   1.7   --    ALB  2.7*  2.7*  3.5     Recent Labs      04/29/12   0554  04/28/12   0404  04/27/12   1153   WBC  8.2  8.9  12.5*   HGB  9.4*  10.2*  12.2   HCT  28.9*  31.7*  37.7  PLT  260  271  323     Recent Labs      04/28/12   0404  04/27/12   2139  04/27/12   1153   CPK  71  24*   --    CPKMB  <0.5*  <0.5*   --    CKNDX  CANNOT BE CALCULATED  CANNOT BE CALCULATED   --    TROIQ  <0.04  <0.04  <0.04     No results found for this basename: INR, PTP, APTT,  in the last 72 hours  No results found for this basename: GLPOC       Current Facility-Administered Medications   Medication Dose Route Frequency   ??? [EXPIRED] sodium chloride (NS) 0.9 % flush       ??? [COMPLETED] sodium chloride (NS) 0.9 % flush       ??? [START ON 05/01/2012] dextrose 5% - 0.45% NaCl with KCl 20 mEq/L infusion  125 mL/hr IntraVENous CONTINUOUS   ??? pantoprazole (PROTONIX) injection 40 mg  40 mg IntraVENous DAILY   ??? lactobac ac& pc-s.therm-b.anim (Leesha Q)  1 Cap Oral DAILY   ??? HYDROmorphone (PF) (DILAUDID) injection 1 mg  1 mg IntraVENous Q3H PRN   ??? atenolol (TENORMIN) tablet 25 mg  25 mg Oral QHS   ??? methimazole (TAPAZOLE) tablet 5 mg  5 mg Oral QHS   ??? 0.9% sodium chloride infusion  75 mL/hr IntraVENous CONTINUOUS   ??? ondansetron (ZOFRAN) injection 4 mg  4 mg IntraVENous Q4H PRN   ??? bisacodyl (DULCOLAX) suppository 10 mg  10 mg Rectal DAILY PRN   ??? bisacodyl (DULCOLAX) tablet 5 mg  5 mg Oral DAILY PRN   ??? docusate sodium (COLACE) capsule 100 mg  100 mg Oral BID   ??? lisinopril (PRINIVIL, ZESTRIL) tablet 20 mg  20 mg Oral DAILY   ??? hydrochlorothiazide (HYDRODIURIL) tablet 25 mg  25 mg Oral DAILY        Total time spent with patient/care coordination: 25 minutes.    David Towson A. Melvyn Neth, NP

## 2012-04-30 NOTE — Progress Notes (Signed)
TRANSFER - OUT REPORT:    Verbal report given to Emily P.   Hazle Nordmann  being transferred to [facility]   routine progression of care       Report consisted of patient???s Situation, Background, Assessment and   Recommendations(SBAR).     Information from the following report(s) SBAR was reviewed with the receiving case manager.   Opportunity for questions and clarification was provided.

## 2012-04-30 NOTE — Progress Notes (Signed)
Please see the more detailed note from Cassandra A Lewis, the NP.  Patient was seen and examined.  I agree with NP's assessment and plan.

## 2012-05-01 LAB — TYPE AND SCREEN
ABO/Rh: O POS
Antibody Screen: NEGATIVE

## 2012-05-01 LAB — TYPE & SCREEN
ABO/Rh(D): O POS
Antibody screen: NEGATIVE

## 2012-05-01 MED ADMIN — dextrose 5% - 0.45% NaCl with KCl 20 mEq/L infusion: INTRAVENOUS | @ 22:00:00 | NDC 00409790209

## 2012-05-01 MED ADMIN — lactated ringers infusion: INTRAVENOUS | @ 14:00:00 | NDC 11845118709

## 2012-05-01 MED ADMIN — ceFAZolin in 0.9% NS (ANCEF) IVPB Soln: @ 12:00:00 | NDC 09999966850

## 2012-05-01 MED ADMIN — ceFAZolin (ANCEF) 1 g in 0.9% sodium chloride (MBP/ADV) 50 mL MBP: INTRAVENOUS | @ 12:00:00 | NDC 63323023765

## 2012-05-01 MED ADMIN — piperacillin-tazobactam (ZOSYN) 3.375 g in 0.9% sodium chloride (MBP/ADV) 100 mL MBP: INTRAVENOUS | @ 20:00:00 | NDC 00338055318

## 2012-05-01 MED ADMIN — 0.9% sodium chloride infusion: INTRAVENOUS | @ 02:00:00 | NDC 00409798309

## 2012-05-01 MED ADMIN — acetaminophen (OFIRMEV) infusion 1,000 mg: INTRAVENOUS | @ 12:00:00 | NDC 43825010201

## 2012-05-01 MED ADMIN — bupivacaine-EPINEPHrine (PF) (SENSORCAINE PF) 0.5 %-1:200,000 injection 150 mg: SUBCUTANEOUS | @ 13:00:00 | NDC 00961002004

## 2012-05-01 MED ADMIN — dextrose 5% - 0.45% NaCl with KCl 20 mEq/L infusion: INTRAVENOUS | @ 14:00:00 | NDC 00409790209

## 2012-05-01 MED ADMIN — HYDROmorphone (PF) (DILAUDID) injection 1 mg: INTRAVENOUS | @ 18:00:00 | NDC 59011044110

## 2012-05-01 MED ADMIN — sodium chloride (NS) flush 5-10 mL: INTRAVENOUS | @ 12:00:00 | NDC 87701099893

## 2012-05-01 MED ADMIN — piperacillin-tazobactam (ZOSYN) 3.375 g in 0.9% sodium chloride (MBP/ADV) 100 mL MBP: INTRAVENOUS | @ 13:00:00 | NDC 00338055318

## 2012-05-01 MED ADMIN — lactated ringers infusion: INTRAVENOUS | @ 12:00:00 | NDC 00409795309

## 2012-05-01 MED ADMIN — lisinopril (PRINIVIL, ZESTRIL) tablet 20 mg: ORAL | @ 15:00:00 | NDC 68084006211

## 2012-05-01 MED ADMIN — dextrose 5% - 0.45% NaCl with KCl 20 mEq/L infusion: INTRAVENOUS | @ 06:00:00 | NDC 00409790209

## 2012-05-01 MED ADMIN — 0.9% sodium chloride infusion: INTRAVENOUS | @ 12:00:00 | NDC 87701099893

## 2012-05-01 MED ADMIN — hydrochlorothiazide (HYDRODIURIL) tablet 25 mg: ORAL | @ 15:00:00 | NDC 68084008611

## 2012-05-01 MED ADMIN — pantoprazole (PROTONIX) injection 40 mg: INTRAVENOUS | @ 15:00:00 | NDC 63323018610

## 2012-05-01 MED ADMIN — HYDROmorphone (PF) (DILAUDID) injection 1 mg: INTRAVENOUS | @ 06:00:00 | NDC 00409255201

## 2012-05-01 MED ADMIN — methimazole (TAPAZOLE) tablet 5 mg: ORAL | @ 02:00:00 | NDC 68084027511

## 2012-05-01 MED ADMIN — HYDROmorphone (PF) (DILAUDID) injection 1 mg: INTRAVENOUS | @ 22:00:00 | NDC 59011044110

## 2012-05-01 MED ADMIN — sodium chloride 0.9 % injection: @ 06:00:00 | NDC 63323018610

## 2012-05-01 MED ADMIN — bupivacaine-EPINEPHrine (PF) (SENSORCAINE PF) 0.5 %-1:200,000 injection 150 mg: SUBCUTANEOUS | @ 13:00:00 | NDC 63323046231

## 2012-05-01 MED FILL — HYDROMORPHONE (PF) 1 MG/ML IJ SOLN: 1 mg/mL | INTRAMUSCULAR | Qty: 1

## 2012-05-01 MED FILL — ONDANSETRON (PF) 4 MG/2 ML INJECTION: 4 mg/2 mL | INTRAMUSCULAR | Qty: 2

## 2012-05-01 MED FILL — D5-1/2 NS & POTASSIUM CHLORIDE 20 MEQ/L IV: 20 mEq/L | INTRAVENOUS | Qty: 1000

## 2012-05-01 MED FILL — ATROPINE 0.4 MG/ML IJ SOLN: 0.4 mg/mL | INTRAMUSCULAR | Qty: 2

## 2012-05-01 MED FILL — ROCURONIUM 10 MG/ML IV: 10 mg/mL | INTRAVENOUS | Qty: 5

## 2012-05-01 MED FILL — LACTATED RINGERS IV: INTRAVENOUS | Qty: 1000

## 2012-05-01 MED FILL — ZOSYN 3.375 GRAM INTRAVENOUS SOLUTION: 3.375 gram | INTRAVENOUS | Qty: 3.38

## 2012-05-01 MED FILL — QUELICIN 20 MG/ML INJECTION SOLUTION: 20 mg/mL | INTRAMUSCULAR | Qty: 10

## 2012-05-01 MED FILL — ATENOLOL 25 MG TAB: 25 mg | ORAL | Qty: 1

## 2012-05-01 MED FILL — SENSORCAINE-MPF/EPINEPHRINE 0.5 %-1:200,000 INJECTION SOLUTION: 0.5 %-1:200,000 | INTRAMUSCULAR | Qty: 30

## 2012-05-01 MED FILL — NEOSTIGMINE METHYLSULFATE 1 MG/ML INJECTION: 1 mg/mL | INTRAMUSCULAR | Qty: 10

## 2012-05-01 MED FILL — XYLOCAINE-MPF 20 MG/ML (2 %) INJECTION SOLUTION: 20 mg/mL (2 %) | INTRAMUSCULAR | Qty: 5

## 2012-05-01 MED FILL — HYDROCHLOROTHIAZIDE 25 MG TAB: 25 mg | ORAL | Qty: 1

## 2012-05-01 MED FILL — METHIMAZOLE 5 MG TAB: 5 mg | ORAL | Qty: 1

## 2012-05-01 MED FILL — CEFAZOLIN 2 GRAM/50 ML NS IVPB: INTRAVENOUS | Qty: 50

## 2012-05-01 MED FILL — DIPRIVAN 10 MG/ML INTRAVENOUS EMULSION: 10 mg/mL | INTRAVENOUS | Qty: 20

## 2012-05-01 MED FILL — SODIUM CHLORIDE 0.9 % IV: INTRAVENOUS | Qty: 1000

## 2012-05-01 MED FILL — SODIUM CHLORIDE 0.9 % INJECTION: INTRAMUSCULAR | Qty: 10

## 2012-05-01 MED FILL — METOPROLOL TARTRATE 5 MG/5 ML IV SOLN: 5 mg/ mL | INTRAVENOUS | Qty: 5

## 2012-05-01 MED FILL — LISINOPRIL 20 MG TAB: 20 mg | ORAL | Qty: 1

## 2012-05-01 MED FILL — XYLOCAINE-MPF 10 MG/ML (1 %) INJECTION SOLUTION: 10 mg/mL (1 %) | INTRAMUSCULAR | Qty: 5

## 2012-05-01 MED FILL — DEXAMETHASONE SODIUM PHOSPHATE 4 MG/ML IJ SOLN: 4 mg/mL | INTRAMUSCULAR | Qty: 1

## 2012-05-01 NOTE — Progress Notes (Signed)
bp running low. Spoke with MD on phone. New orders given. Will monitor.

## 2012-05-01 NOTE — Progress Notes (Signed)
Patient febrile. Prn tylenol given. Rechecked 1 hour later. Temperature still increasing. MD called and informed. New orders given. Will monitor.

## 2012-05-01 NOTE — Progress Notes (Addendum)
Hospitalist called by charge Hosie Poisson and informed of patient's bp and temperature. New orders received.

## 2012-05-01 NOTE — Progress Notes (Signed)
Discussed this am with Dr. Jimmey Ralph and he will resume Attending today as he is taking to OR for unroofing of large hepatic cyst. Hospitalist team to sign off. Please call with questions.      Loma Newton, ACNP  Hospitalist

## 2012-05-01 NOTE — Progress Notes (Signed)
Bedside and Verbal shift change report given to Aimee (oncoming nurse) by Jonathan (offgoing nurse).  Report given with SBAR, Kardex and MAR.

## 2012-05-01 NOTE — Brief Op Note (Signed)
BRIEF OPERATIVE NOTE    Date of Procedure: 05/01/2012   Preoperative Diagnosis: HEPATIC CYST  Postoperative Diagnosis: HEPATIC CYST,PROBABLY INFECTED   Procedure: Procedure(s):UNROOFING OF INFECTED HEPATIS CYST AND C ULTURES OF THE CYST FLUID    Surgeon(s) and Role:     * Carolynn Sayers, MD - Primary  Anesthesia: General   Estimated Blood Loss: minimal  Specimens:   ID Type Source Tests Collected by Time Destination   1 : hepatic cyst wall Fresh Liver  Carolynn Sayers, MD 05/01/2012 0900 Pathology   1 : infected hepatic cyst Body Fluid  CULTURE, ANAEROBIC Carolynn Sayers, MD 05/01/2012 [phone] Microbiology   2 : infected hepatic gyst Body Fluid  CULTURE, BODY FLUID Carolynn Sayers, MD 05/01/2012 [phone] Microbiology      Findings: brownish fluid in the cyst   Complications: none  Implants: * No implants in log *  [id number]

## 2012-05-01 NOTE — Other (Signed)
SBAR REPORT WAS CALLED TO AMY ON [address]. WAITING AREA CALLED AND SHE HAS NO BELONGINGS WITH HER.

## 2012-05-01 NOTE — Progress Notes (Signed)
TRANSFER - IN REPORT:    Verbal report received from Kim RN(name) on [employer]  being received from PACU  (unit) for routine post - op      Report consisted of patient???s Situation, Background, Assessment and   Recommendations(SBAR).     Information from the following report(s) SBAR, Kardex and MAR was reviewed with the receiving nurse.    Opportunity for questions and clarification was provided.      Assessment completed upon patient???s arrival to unit and care assumed.

## 2012-05-01 NOTE — Progress Notes (Signed)
Post-Anesthesia Evaluation and Assessment    Patient: Sandra Walsh MRN: [id number]  SSN: [id number]    Date of Birth: 07-08-1945  Age: 66 y.o.  Sex: female       Cardiovascular Function/Vital Signs  Visit Vitals   Item Reading   ??? BP 115/53   ??? Pulse 64   ??? Temp 99 ??F (37.2 ??C)   ??? Resp 21   ??? Ht 5' 4.5" (1.638 m)   ??? Wt 85.8 kg (189 lb 2.5 oz)   ??? BMI 31.98 kg/m2   ??? SpO2 97%       Patient is status post  Procedure(s) with comments:  UNROOFING HEPATIC CYST  Laparascopic.    Nausea/Vomiting: none    Postoperative hydration reviewed and adequate  Pain:  Pain Scale 1: Numeric (0 - 10) (05/01/12 1026)  Pain Intensity 1: 0 (05/01/12 1026)   Pain managed    Neurological Status:   Neuro (WDL): Within Defined Limits (05/01/12 1026)  Neuro  Neurologic State: Appropriate for age;Eyes open to voice;Sleeping (05/01/12 0941)   Neuro status at baseline    Mental Status and Level of Consciousness: Alert and oriented to person, place, and time    Pulmonary Status:   O2 Device: Nasal cannula (05/01/12 1026)   Adequate oxygenation and airway patent    Complications related to anesthesia: None  Post-anesthesia assessment completed. No concerns    Signed By: Dalia Heading, MD     May 01, 2012

## 2012-05-01 NOTE — Progress Notes (Signed)
Please see the more detailed note from Paulino Rily, the NP.

## 2012-05-01 NOTE — Progress Notes (Signed)
TRANSFER - OUT REPORT:    Verbal report given to Kirsten(name) on [place]  being transferred to OR holding(unit) for ordered procedure       Report consisted of patient???s Situation, Background, Assessment and   Recommendations(SBAR).     Information from the following report(s) SBAR, Kardex and MAR was reviewed with the receiving nurse.    Opportunity for questions and clarification was provided.

## 2012-05-01 NOTE — Op Note (Signed)
Name:      Sandra Walsh, Sandra Walsh                                          Surgeon:        Cala Kruckenberg A Erynne Kealey, MD  Account #: [id number]                 Surgery Date:   05/01/2012  DOB:       01/15/1946  Age:       66                           Location:                                 OPERATIVE REPORT      PREOPERATIVE DIAGNOSIS: Hepatic cyst.    POSTOPERATIVE DIAGNOSIS: Probable infected hepatic cyst.    PROCEDURES PERFORMED:  Laparoscopic unroofing of infected hepatic cyst with  cultures.    SURGEON: Sarahmarie Leavey A. Annaleese Guier, MD    ANESTHESIA: General supplemented with 0.5% Marcaine with epinephrine.    ESTIMATED BLOOD LOSS: Minimal.    SPECIMENS REMOVED: Cultures of the fluid and also the cyst wall.    COMPLICATIONS: None.    DRAINS: None.    CONDITION: Good to the PACU.    FINDINGS: A fairly thick-walled cyst with a lot of inflammatory changes  around it and omentum stuck to it. The contents were quite murky and looked  like old blood, but with more of a purulent appearance than the usual clear  fluid seen in the hepatic cyst.    DESCRIPTION OF PROCEDURE:  With the patient supine and suitably  anesthetized, the abdomen was prepared with ChloraPrep and draped as a  field. Three sites were chosen for the 2, 5 mm and a 12 mm, which was  placed at the umbilical site.    The abdominal cavity was entered with a Visi-Port in the left mid abdomen.  The camera was inserted and then a 12 mm was placed in the subumbilical  position and another 5 mm was placed in the right mid abdomen. The patient  was placed in reverse Trendelenburg position and the omentum stuck to the  cyst was taken off by blunt and Harmonic dissection. Using the Harmonic  scalpel, I entered the cyst and immediately there was this murky,  orangish-brown colored fluid that exuded. I aspirated as much as I could  with the aspirator and then cultured as well. I unroofed the cyst using the  Harmonic scalpel. Little bleeders in the liver edge were coagulated. The  omentum  was placed up into this area then the trocars were removed.    The defect at the 12 mm site was closed with 2-0 Vicryl. The skin was  closed with subcuticular Monocryl followed by Dermabond at each site.    At the termination of the procedure, all counts were correct. The patient  tolerated this well and was brought to the PACU in satisfactory condition.            Namine Beahm A Denaya Horn, MD    cc:   James Russell Dageforde, MD        Beautiful Pensyl A Cornelius Schuitema, MD        GAP/wmx; D: 05/01/2012 10:15 A; T: 05/01/2012   02:15 P; Doc# [id number]; Job#  [id number]

## 2012-05-01 NOTE — Op Note (Signed)
Name:      Sandra Walsh                                          Surgeon:        Carolynn Sayers, MD  Account #: [id number]                 Surgery Date:   05/01/2012  DOB:       05/26/1946  Age:       66                           Location:                                 OPERATIVE REPORT      PREOPERATIVE DIAGNOSIS: Hepatic cyst.    POSTOPERATIVE DIAGNOSIS: Probable infected hepatic cyst.    PROCEDURES PERFORMED:  Laparoscopic unroofing of infected hepatic cyst with  cultures.    SURGEON: Tyrone Nine. Jimmey Ralph, MD    ANESTHESIA: General supplemented with 0.5% Marcaine with epinephrine.    ESTIMATED BLOOD LOSS: Minimal.    SPECIMENS REMOVED: Cultures of the fluid and also the cyst wall.    COMPLICATIONS: None.    DRAINS: None.    CONDITION: Good to the PACU.    FINDINGS: A fairly thick-walled cyst with a lot of inflammatory changes  around it and omentum stuck to it. The contents were quite murky and looked  like old blood, but with more of a purulent appearance than the usual clear  fluid seen in the hepatic cyst.    DESCRIPTION OF PROCEDURE:  With the patient supine and suitably  anesthetized, the abdomen was prepared with ChloraPrep and draped as a  field. Three sites were chosen for the 2, 5 mm and a 12 mm, which was  placed at the umbilical site.    The abdominal cavity was entered with a Visi-Port in the left mid abdomen.  The camera was inserted and then a 12 mm was placed in the subumbilical  position and another 5 mm was placed in the right mid abdomen. The patient  was placed in reverse Trendelenburg position and the omentum stuck to the  cyst was taken off by blunt and Harmonic dissection. Using the Harmonic  scalpel, I entered the cyst and immediately there was this murky,  orangish-brown colored fluid that exuded. I aspirated as much as I could  with the aspirator and then cultured as well. I unroofed the cyst using the  Harmonic scalpel. Little bleeders in the liver edge were coagulated. The  omentum  was placed up into this area then the trocars were removed.    The defect at the 12 mm site was closed with 2-0 Vicryl. The skin was  closed with subcuticular Monocryl followed by Dermabond at each site.    At the termination of the procedure, all counts were correct. The patient  tolerated this well and was brought to the PACU in satisfactory condition.            Carolynn Sayers, MD    cc:   Orma Flaming, MD        Carolynn Sayers, MD        GAP/wmx; D: 05/01/2012 10:15 A; T: 05/01/2012  02:15 P; Doc# [id number]; Job#  [id number]

## 2012-05-02 LAB — CBC WITH AUTOMATED DIFF
ABS. BASOPHILS: 0.1 10*3/uL (ref 0.0–0.1)
ABS. EOSINOPHILS: 0 10*3/uL (ref 0.0–0.4)
ABS. LYMPHOCYTES: 2.5 10*3/uL (ref 0.8–3.5)
ABS. MONOCYTES: 1.3 10*3/uL — ABNORMAL HIGH (ref 0.0–1.0)
ABS. NEUTROPHILS: 9.1 10*3/uL — ABNORMAL HIGH (ref 1.8–8.0)
BAND NEUTROPHILS: 1 % (ref 0–6)
BASOPHILS: 1 % (ref 0–1)
EOSINOPHILS: 0 % (ref 0–7)
HCT: 25.6 % — ABNORMAL LOW (ref 35.0–47.0)
HGB: 8.3 g/dL — ABNORMAL LOW (ref 11.5–16.0)
LYMPHOCYTES: 19 % (ref 12–49)
MCH: 26.2 PG (ref 26.0–34.0)
MCHC: 32.4 g/dL (ref 30.0–36.5)
MCV: 80.8 FL (ref 80.0–99.0)
MONOCYTES: 10 % (ref 5–13)
NEUTROPHILS: 69 % (ref 32–75)
PLATELET: 317 10*3/uL (ref 150–400)
RBC: 3.17 M/uL — ABNORMAL LOW (ref 3.80–5.20)
RDW: 13.7 % (ref 11.5–14.5)
WBC: 13 10*3/uL — ABNORMAL HIGH (ref 3.6–11.0)

## 2012-05-02 LAB — METABOLIC PANEL, BASIC
Anion gap: 6 mmol/L (ref 5–15)
BUN/Creatinine ratio: 16 (ref 12–20)
BUN: 10 MG/DL (ref 6–20)
CO2: 27 MMOL/L (ref 21–32)
Calcium: 7.4 MG/DL — ABNORMAL LOW (ref 8.5–10.1)
Chloride: 107 MMOL/L (ref 97–108)
Creatinine: 0.62 MG/DL (ref 0.45–1.15)
GFR est AA: >60 mL/min/{1.73_m2} (ref >60)
GFR est non-AA: >60 mL/min/{1.73_m2} (ref >60)
Glucose: 139 MG/DL — ABNORMAL HIGH (ref 65–100)
Potassium: 3.6 MMOL/L (ref 3.5–5.1)
Sodium: 140 MMOL/L (ref 136–145)

## 2012-05-02 LAB — LACTIC ACID: Lactic acid: 1.8 MMOL/L (ref 0.4–2.0)

## 2012-05-02 MED ADMIN — dextrose 5% - 0.45% NaCl with KCl 20 mEq/L infusion: INTRAVENOUS | @ 08:00:00 | NDC 00409790209

## 2012-05-02 MED ADMIN — dextrose 5% - 0.45% NaCl with KCl 20 mEq/L infusion: INTRAVENOUS | @ 23:00:00 | NDC 00409790209

## 2012-05-02 MED ADMIN — piperacillin-tazobactam (ZOSYN) 3.375 g in 0.9% sodium chloride (MBP/ADV) 100 mL MBP: INTRAVENOUS | @ 01:00:00 | NDC 00338055318

## 2012-05-02 MED ADMIN — acetaminophen (OFIRMEV) infusion 650 mg: INTRAVENOUS | @ 16:00:00 | NDC 43825010201

## 2012-05-02 MED ADMIN — acetaminophen (OFIRMEV) infusion 650 mg: INTRAVENOUS | @ 03:00:00 | NDC 43825010201

## 2012-05-02 MED ADMIN — piperacillin-tazobactam (ZOSYN) 3.375 g in 0.9% sodium chloride (MBP/ADV) 100 mL MBP: INTRAVENOUS | @ 08:00:00 | NDC 00338055318

## 2012-05-02 MED ADMIN — sodium chloride 0.9 % bolus infusion 500 mL: INTRAVENOUS | @ 03:00:00 | NDC 00409798303

## 2012-05-02 MED ADMIN — docusate sodium (COLACE) capsule 100 mg: ORAL | @ 14:00:00 | NDC 62584068311

## 2012-05-02 MED ADMIN — pantoprazole (PROTONIX) injection 40 mg: INTRAVENOUS | @ 14:00:00 | NDC 63323018610

## 2012-05-02 MED ADMIN — docusate sodium (COLACE) capsule 100 mg: ORAL | @ 23:00:00 | NDC 62584068311

## 2012-05-02 MED ADMIN — HYDROmorphone (PF) (DILAUDID) injection 1 mg: INTRAVENOUS | NDC 00409255201

## 2012-05-02 MED ADMIN — piperacillin-tazobactam (ZOSYN) 3.375 g in 0.9% sodium chloride (MBP/ADV) 100 mL MBP: INTRAVENOUS | @ 19:00:00 | NDC 00338055318

## 2012-05-02 MED ADMIN — acetaminophen (OFIRMEV) infusion 650 mg: INTRAVENOUS | @ 21:00:00 | NDC 43825010201

## 2012-05-02 MED ADMIN — vancomycin (VANCOCIN) 1500 mg in NS 250 ml infusion: INTRAVENOUS | @ 18:00:00 | NDC 09999945250

## 2012-05-02 MED ADMIN — ondansetron (ZOFRAN) injection 4 mg: INTRAVENOUS | NDC 00409475503

## 2012-05-02 MED ADMIN — methimazole (TAPAZOLE) tablet 5 mg: ORAL | @ 03:00:00 | NDC 68084027511

## 2012-05-02 MED ADMIN — vancomycin (VANCOCIN) 2000 mg in NS 500 ml infusion: INTRAVENOUS | @ 05:00:00 | NDC 09999945502

## 2012-05-02 MED ADMIN — metroNIDAZOLE (FLAGYL) IVPB premix 500 mg: INTRAVENOUS | @ 23:00:00 | NDC 00338105548

## 2012-05-02 MED ADMIN — acetaminophen (OFIRMEV) infusion 650 mg: INTRAVENOUS | @ 10:00:00 | NDC 43825010201

## 2012-05-02 MED ADMIN — dextrose 5% - 0.45% NaCl with KCl 20 mEq/L infusion: INTRAVENOUS | @ 16:00:00 | NDC 00409790209

## 2012-05-02 MED ADMIN — acetaminophen (TYLENOL) tablet 650 mg: ORAL | @ 02:00:00 | NDC 50580050130

## 2012-05-02 MED ADMIN — piperacillin-tazobactam (ZOSYN) 3.375 g in 0.9% sodium chloride (MBP/ADV) 100 mL MBP: INTRAVENOUS | @ 14:00:00 | NDC 00338055318

## 2012-05-02 MED FILL — OFIRMEV 1,000 MG/100 ML (10 MG/ML) INTRAVENOUS SOLUTION: 1000 mg/100 mL (10 mg/mL) | INTRAVENOUS | Qty: 100

## 2012-05-02 MED FILL — ZOSYN 3.375 GRAM INTRAVENOUS SOLUTION: 3.375 gram | INTRAVENOUS | Qty: 3.38

## 2012-05-02 MED FILL — D5-1/2 NS & POTASSIUM CHLORIDE 20 MEQ/L IV: 20 mEq/L | INTRAVENOUS | Qty: 1000

## 2012-05-02 MED FILL — SODIUM CHLORIDE 0.9 % IV: INTRAVENOUS | Qty: 500

## 2012-05-02 MED FILL — HYDROCHLOROTHIAZIDE 25 MG TAB: 25 mg | ORAL | Qty: 1

## 2012-05-02 MED FILL — PHARMACY INFORMATION NOTE: Qty: 1

## 2012-05-02 MED FILL — VANCOMYCIN IN 0.9 % SODIUM CHLORIDE 1.5 GRAM/250 ML IV: 1.5 gram/250 mL | INTRAVENOUS | Qty: 250

## 2012-05-02 MED FILL — ACETAMINOPHEN 325 MG TABLET: 325 mg | ORAL | Qty: 2

## 2012-05-02 MED FILL — VANCOMYCIN IN 0.9 % SODIUM CHLORIDE 2 GRAM/500 ML IV: 2 gram/500 mL | INTRAVENOUS | Qty: 500

## 2012-05-02 MED FILL — METRONIDAZOLE IN SODIUM CHLORIDE (ISO-OSM) 500 MG/100 ML IV PIGGY BACK: 500 mg/100 mL | INTRAVENOUS | Qty: 100

## 2012-05-02 MED FILL — DOCUSATE SODIUM 100 MG CAP: 100 mg | ORAL | Qty: 1

## 2012-05-02 MED FILL — HYDROMORPHONE (PF) 1 MG/ML IJ SOLN: 1 mg/mL | INTRAMUSCULAR | Qty: 1

## 2012-05-02 MED FILL — METHIMAZOLE 5 MG TAB: 5 mg | ORAL | Qty: 1

## 2012-05-02 MED FILL — SODIUM CHLORIDE 0.9 % INJECTION: INTRAMUSCULAR | Qty: 10

## 2012-05-02 MED FILL — LISINOPRIL 20 MG TAB: 20 mg | ORAL | Qty: 1

## 2012-05-02 NOTE — Progress Notes (Signed)
Vancomycin Dosing per Pharmacy Day #1    Consult provided for this 66 y.o., female for indication of UTI.  Patient also on the following antibiotic regimens:    Zosyn 3.375 G Q 6 H      Creatinine   Date/Time Value Range Status   05/02/2012  4:00 AM 0.62  0.45 - 1.15 MG/DL Final   04/54/0981  1:91 AM 0.57  0.45 - 1.15 MG/DL Final        BUN   Date/Time Value Range Status   05/02/2012  4:00 AM 10  6 - 20 MG/DL Final   47/82/9562  1:30 AM 19  6 - 20 MG/DL Final        WBC   Date/Time Value Range Status   05/02/2012  4:00 AM 13.0* 3.6 - 11.0 K/uL Final   04/29/2012  5:54 AM 8.2  3.6 - 11.0 K/uL Final   ]    Estimated CrCl = 97 ml/min.    Cultures:   11/1 blood-pending  11/1 anaerobic-pending  11/1 wound-pending  Vancomycin loading dose of ~0100 (mg) IV given at 05/02/12 (time/date).     Goal Vancomycin trough = 15-20 mcg/mL    A maintenance dose of 1500(mg) every 12 hours will be started at 1300 05/02/12 (time/date) and is expected to yield a therapeutic trough of 16 mcg/mL.  Pharmacy to follow patient daily and order levels / dose adjustments as appropriate.    Pharmacist Gwenlyn Saran, RPH

## 2012-05-02 NOTE — Progress Notes (Signed)
POD 1:  Laparoscopic unroofing of hepatic cysts    S:  No acute surgical issues.  Pt had high grade fever 103 last night.  Fever coming down with Tylenol.    Visit Vitals   Item Reading   ??? BP 101/62   ??? Pulse 80   ??? Temp 98.7 ??F (37.1 ??C)   ??? Resp 18   ??? Ht 5' 4.5" (1.638 m)   ??? Wt 189 lb 2.5 oz (85.8 kg)   ??? BMI 31.98 kg/m2   ??? SpO2 95%       Gen:  NAD  CV:  RRR  Pulm:  Unlabored  Abd:  S/ND/appropriate TTP/no guarding or rebound    Recent Results (from the past 24 hour(s))   CULTURE, BLOOD, PAIRED    Collection Time    05/01/12 11:28 PM       Result Value Range    Specimen Description: BLOOD      Special Requests: NO SPECIAL REQUESTS      Culture result: NO GROWTH AFTER 5 HOURS      Report Status PENDING     LACTIC ACID, PLASMA    Collection Time    05/01/12 11:38 PM       Result Value Range    Lactic acid 1.8  0.4 - 2.0 MMOL/L   CBC WITH AUTOMATED DIFF    Collection Time    05/02/12  4:00 AM       Result Value Range    WBC 13.0 (*) 3.6 - 11.0 K/uL    RBC 3.17 (*) 3.80 - 5.20 M/uL    HGB 8.3 (*) 11.5 - 16.0 g/dL    HCT 16.1 (*) 09.6 - 47.0 %    MCV 80.8  80.0 - 99.0 FL    MCH 26.2  26.0 - 34.0 PG    MCHC 32.4  30.0 - 36.5 g/dL    RDW 04.5  40.9 - 81.1 %    PLATELET 317  150 - 400 K/uL    NEUTROPHILS 69  32 - 75 %    BANDS 1  0 - 6 %    LYMPHOCYTES 19  12 - 49 %    MONOCYTES 10  5 - 13 %    EOSINOPHILS 0  0 - 7 %    BASOPHILS 1  0 - 1 %    ABS. NEUTROPHILS 9.1 (*) 1.8 - 8.0 K/UL    ABS. LYMPHOCYTES 2.5  0.8 - 3.5 K/UL    ABS. MONOCYTES 1.3 (*) 0.0 - 1.0 K/UL    ABS. EOSINOPHILS 0.0  0.0 - 0.4 K/UL    ABS. BASOPHILS 0.1  0.0 - 0.1 K/UL    DF MANUAL      RBC COMMENTS POLYCHROMASIA PRESENT     METABOLIC PANEL, BASIC    Collection Time    05/02/12  4:00 AM       Result Value Range    Sodium 140  136 - 145 MMOL/L    Potassium 3.6  3.5 - 5.1 MMOL/L    Chloride 107  97 - 108 MMOL/L    CO2 27  21 - 32 MMOL/L    Anion gap 6  5 - 15 mmol/L    Glucose 139 (*) 65 - 100 MG/DL    BUN 10  6 - 20 MG/DL    Creatinine 9.14  7.82 - 1.15  MG/DL    BUN/Creatinine ratio 16  12 - 20      GFR est  AA >60  >60 ml/min/1.36m2    GFR est non-AA >60  >60 ml/min/1.96m2    Calcium 7.4 (*) 8.5 - 10.1 MG/DL         AP:  66 yo woman with liver cyst  1.  No acute surgical issues.   2. Add flagyl to zosyn abx  3. IV Tylenol  4. Regular diet  5. CBC in am

## 2012-05-02 NOTE — Progress Notes (Signed)
Bedside and Verbal shift change report given to Amy (oncoming nurse) by Jonathan (offgoing nurse).  Report given with SBAR, Kardex and MAR.

## 2012-05-03 LAB — CBC W/O DIFF
HCT: 28.7 % — ABNORMAL LOW (ref 35.0–47.0)
HGB: 9.2 g/dL — ABNORMAL LOW (ref 11.5–16.0)
MCH: 25.8 PG — ABNORMAL LOW (ref 26.0–34.0)
MCHC: 32.1 g/dL (ref 30.0–36.5)
MCV: 80.4 FL (ref 80.0–99.0)
PLATELET: 347 10*3/uL (ref 150–400)
RBC: 3.57 M/uL — ABNORMAL LOW (ref 3.80–5.20)
RDW: 13.9 % (ref 11.5–14.5)
WBC: 11.3 10*3/uL — ABNORMAL HIGH (ref 3.6–11.0)

## 2012-05-03 MED ADMIN — pantoprazole (PROTONIX) injection 40 mg: INTRAVENOUS | @ 15:00:00 | NDC 63323018610

## 2012-05-03 MED ADMIN — sodium chloride (NS) 0.9 % flush: @ 06:00:00 | NDC 82903065462

## 2012-05-03 MED ADMIN — piperacillin-tazobactam (ZOSYN) 3.375 g in 0.9% sodium chloride (MBP/ADV) 100 mL MBP: INTRAVENOUS | @ 15:00:00 | NDC 00338055318

## 2012-05-03 MED ADMIN — HYDROmorphone (PF) (DILAUDID) injection 1 mg: INTRAVENOUS | @ 09:00:00 | NDC 00409255201

## 2012-05-03 MED ADMIN — ondansetron (ZOFRAN) injection 4 mg: INTRAVENOUS | @ 05:00:00 | NDC 00409475503

## 2012-05-03 MED ADMIN — methimazole (TAPAZOLE) tablet 5 mg: ORAL | @ 03:00:00 | NDC 68084027511

## 2012-05-03 MED ADMIN — piperacillin-tazobactam (ZOSYN) 3.375 g in 0.9% sodium chloride (MBP/ADV) 100 mL MBP: INTRAVENOUS | @ 01:00:00 | NDC 00338055318

## 2012-05-03 MED ADMIN — HYDROmorphone (PF) (DILAUDID) injection 1 mg: INTRAVENOUS | @ 05:00:00 | NDC 00409255201

## 2012-05-03 MED ADMIN — ondansetron (ZOFRAN) injection 4 mg: INTRAVENOUS | @ 13:00:00 | NDC 00409475503

## 2012-05-03 MED ADMIN — dextrose 5% - 0.45% NaCl with KCl 20 mEq/L infusion: INTRAVENOUS | @ 06:00:00 | NDC 00409790209

## 2012-05-03 MED ADMIN — ondansetron (ZOFRAN) injection 4 mg: INTRAVENOUS | @ 09:00:00 | NDC 00409475503

## 2012-05-03 MED ADMIN — piperacillin-tazobactam (ZOSYN) 3.375 g in 0.9% sodium chloride (MBP/ADV) 100 mL MBP: INTRAVENOUS | @ 21:00:00 | NDC 00338055318

## 2012-05-03 MED ADMIN — vancomycin (VANCOCIN) 1500 mg in NS 250 ml infusion: INTRAVENOUS | @ 19:00:00 | NDC 09999945250

## 2012-05-03 MED ADMIN — metroNIDAZOLE (FLAGYL) IVPB premix 500 mg: INTRAVENOUS | @ 09:00:00 | NDC 00338105548

## 2012-05-03 MED ADMIN — Vancomycin trough @1200 on 11/3: @ 17:00:00 | NDC 00740100004

## 2012-05-03 MED ADMIN — ondansetron (ZOFRAN) injection 4 mg: INTRAVENOUS | NDC 00409475503

## 2012-05-03 MED ADMIN — vancomycin (VANCOCIN) 1500 mg in NS 250 ml infusion: INTRAVENOUS | @ 05:00:00 | NDC 09999945250

## 2012-05-03 MED ADMIN — piperacillin-tazobactam (ZOSYN) 3.375 g in 0.9% sodium chloride (MBP/ADV) 100 mL MBP: INTRAVENOUS | @ 12:00:00 | NDC 00338055318

## 2012-05-03 MED ADMIN — metroNIDAZOLE (FLAGYL) IVPB premix 500 mg: INTRAVENOUS | NDC 00338105548

## 2012-05-03 MED ADMIN — dextrose 5% - 0.45% NaCl with KCl 20 mEq/L infusion: INTRAVENOUS | NDC 00409790209

## 2012-05-03 MED ADMIN — sodium chloride 0.9 % injection: @ 13:00:00 | NDC 63323018610

## 2012-05-03 MED ADMIN — acetaminophen (OFIRMEV) infusion 650 mg: INTRAVENOUS | @ 18:00:00 | NDC 43825010201

## 2012-05-03 MED FILL — D5-1/2 NS & POTASSIUM CHLORIDE 20 MEQ/L IV: 20 mEq/L | INTRAVENOUS | Qty: 1000

## 2012-05-03 MED FILL — ONDANSETRON (PF) 4 MG/2 ML INJECTION: 4 mg/2 mL | INTRAMUSCULAR | Qty: 2

## 2012-05-03 MED FILL — HYDROMORPHONE (PF) 1 MG/ML IJ SOLN: 1 mg/mL | INTRAMUSCULAR | Qty: 1

## 2012-05-03 MED FILL — BD POSIFLUSH NORMAL SALINE 0.9 % INJECTION SYRINGE: INTRAMUSCULAR | Qty: 20

## 2012-05-03 MED FILL — SODIUM CHLORIDE 0.9 % INJECTION: INTRAMUSCULAR | Qty: 10

## 2012-05-03 MED FILL — METRONIDAZOLE IN SODIUM CHLORIDE (ISO-OSM) 500 MG/100 ML IV PIGGY BACK: 500 mg/100 mL | INTRAVENOUS | Qty: 100

## 2012-05-03 MED FILL — DOCUSATE SODIUM 100 MG CAP: 100 mg | ORAL | Qty: 1

## 2012-05-03 MED FILL — OFIRMEV 1,000 MG/100 ML (10 MG/ML) INTRAVENOUS SOLUTION: 1000 mg/100 mL (10 mg/mL) | INTRAVENOUS | Qty: 100

## 2012-05-03 MED FILL — METHIMAZOLE 5 MG TAB: 5 mg | ORAL | Qty: 1

## 2012-05-03 MED FILL — VANCOMYCIN IN 0.9 % SODIUM CHLORIDE 1.5 GRAM/250 ML IV: 1.5 gram/250 mL | INTRAVENOUS | Qty: 250

## 2012-05-03 MED FILL — ZOSYN 3.375 GRAM INTRAVENOUS SOLUTION: 3.375 gram | INTRAVENOUS | Qty: 3.38

## 2012-05-03 MED FILL — PHARMACY INFORMATION NOTE: Qty: 1

## 2012-05-03 NOTE — Progress Notes (Signed)
Day #2 of Vancomycin  Indication:  Infected hepatic cyst  Current regimen:  1.5g IV q12h  Abx regimen:  Vancomycin + Zosyn + Metronidazole     Recent Labs      05/03/12   0507  05/02/12   0400   WBC  11.3*  13.0*   CREA   --   0.62   BUN   --   10     Estimated CrCl:  ~60 ml/min  Tmax last 24 hours:  AF  Cultures:   10/28 urine - >100K mixed urogenital Lashelle (final)  11/1 hepatic cyst - pending  11/1 blood - NGTD    Trough: 25.0 @1222     Level is elevated on current regimen. Dose adjusted to 1.25g IV q16h. Pharmacy will continue to monitor.    Artie Strunk, Pharm.D., BCPS

## 2012-05-03 NOTE — Progress Notes (Signed)
Notified of Vancomycin Trough level of 25.9, Called Dr Lenn Cal in the OR and notified him of level.  Order given RBTO to Hold 05/03/12 1300 Vancomycin dose. Vancomycin stopped at 1400.  Notified clinical Pharmacist of order and that the Vancomycin was started and then stopped when order was obtained to stop infusion.

## 2012-05-03 NOTE — Progress Notes (Signed)
POD 2:  Laparoscopic unroofing of hepatic cysts    S:  No acute surgical issues.  Pt has been afebrile.  Pt reported some nausea when eating but no vomiting    Visit Vitals   Item Reading   ??? BP 103/60   ??? Pulse 63   ??? Temp 98.3 ??F (36.8 ??C)   ??? Resp 20   ??? Ht 5' 4.5" (1.638 m)   ??? Wt 189 lb 2.5 oz (85.8 kg)   ??? BMI 31.98 kg/m2   ??? SpO2 93%       Gen:  NAD  CV:  RRR  Pulm:  Unlabored  Abd:  S/ND/appropriate TTP/no guarding or rebound    Recent Results (from the past 24 hour(s))   CBC W/O DIFF    Collection Time    05/03/12  5:07 AM       Result Value Range    WBC 11.3 (*) 3.6 - 11.0 K/uL    RBC 3.57 (*) 3.80 - 5.20 M/uL    HGB 9.2 (*) 11.5 - 16.0 g/dL    HCT 96.0 (*) 45.4 - 47.0 %    MCV 80.4  80.0 - 99.0 FL    MCH 25.8 (*) 26.0 - 34.0 PG    MCHC 32.1  30.0 - 36.5 g/dL    RDW 09.8  11.9 - 14.7 %    PLATELET 347  150 - 400 K/uL         AP:  66 yo woman with liver cyst  1.  No acute surgical issues.   2. Add flagyl to zosyn abx  3. IV Tylenol  4. Regular diet  5. CBC in am  6. Bowel regiment

## 2012-05-04 LAB — METABOLIC PANEL, BASIC
Anion gap: 9 mmol/L (ref 5–15)
BUN/Creatinine ratio: 10 — ABNORMAL LOW (ref 12–20)
BUN: 19 MG/DL (ref 6–20)
CO2: 23 MMOL/L (ref 21–32)
Calcium: 8.1 MG/DL — ABNORMAL LOW (ref 8.5–10.1)
Chloride: 114 MMOL/L — ABNORMAL HIGH (ref 97–108)
Creatinine: 1.98 MG/DL — ABNORMAL HIGH (ref 0.45–1.15)
GFR est AA: 31 mL/min/{1.73_m2} — ABNORMAL LOW (ref >60)
GFR est non-AA: 25 mL/min/{1.73_m2} — ABNORMAL LOW (ref >60)
Glucose: 101 MG/DL — ABNORMAL HIGH (ref 65–100)
Potassium: 4 MMOL/L (ref 3.5–5.1)
Sodium: 146 MMOL/L — ABNORMAL HIGH (ref 136–145)

## 2012-05-04 LAB — CBC W/O DIFF
HCT: 27.8 % — ABNORMAL LOW (ref 35.0–47.0)
HGB: 8.9 g/dL — ABNORMAL LOW (ref 11.5–16.0)
MCH: 25.9 PG — ABNORMAL LOW (ref 26.0–34.0)
MCHC: 32 g/dL (ref 30.0–36.5)
MCV: 81 FL (ref 80.0–99.0)
PLATELET: 373 10*3/uL (ref 150–400)
RBC: 3.43 M/uL — ABNORMAL LOW (ref 3.80–5.20)
RDW: 14.2 % (ref 11.5–14.5)
WBC: 9.1 10*3/uL (ref 3.6–11.0)

## 2012-05-04 MED ADMIN — methimazole (TAPAZOLE) tablet 5 mg: ORAL | @ 03:00:00 | NDC 68084027511

## 2012-05-04 MED ADMIN — acetaminophen (OFIRMEV) infusion 650 mg: INTRAVENOUS | @ 05:00:00 | NDC 43825010201

## 2012-05-04 MED ADMIN — hydrochlorothiazide (HYDRODIURIL) tablet 25 mg: ORAL | @ 16:00:00 | NDC 68084008611

## 2012-05-04 MED ADMIN — lisinopril (PRINIVIL, ZESTRIL) tablet 20 mg: ORAL | @ 14:00:00 | NDC 82009006510

## 2012-05-04 MED ADMIN — pantoprazole (PROTONIX) injection 40 mg: INTRAVENOUS | @ 16:00:00 | NDC 63323018610

## 2012-05-04 MED ADMIN — piperacillin-tazobactam (ZOSYN) 3.375 g in 0.9% sodium chloride (MBP/ADV) 100 mL MBP: INTRAVENOUS | @ 06:00:00 | NDC 00338055318

## 2012-05-04 MED ADMIN — prochlorperazine (COMPAZINE) with saline injection 10 mg: INTRAVENOUS | @ 01:00:00 | NDC 63323018610

## 2012-05-04 MED ADMIN — metroNIDAZOLE (FLAGYL) IVPB premix 500 mg: INTRAVENOUS | @ 09:00:00 | NDC 00338105548

## 2012-05-04 MED ADMIN — dextrose 5% - 0.45% NaCl with KCl 20 mEq/L infusion: INTRAVENOUS | @ 16:00:00 | NDC 00409790209

## 2012-05-04 MED ADMIN — piperacillin-tazobactam (ZOSYN) 3.375 g in 0.9% sodium chloride (MBP/ADV) 100 mL MBP: INTRAVENOUS | @ 12:00:00 | NDC 00338055318

## 2012-05-04 MED ADMIN — vancomycin (VANCOCIN) 1250 mg in NS 250 ml infusion: INTRAVENOUS | @ 03:00:00 | NDC 09999945625

## 2012-05-04 MED ADMIN — ondansetron (ZOFRAN) injection 4 mg: INTRAVENOUS | @ 23:00:00 | NDC 00409475503

## 2012-05-04 MED ADMIN — acetaminophen (OFIRMEV) infusion 650 mg: INTRAVENOUS | @ 01:00:00 | NDC 43825010201

## 2012-05-04 MED ADMIN — acetaminophen (OFIRMEV) infusion 650 mg: INTRAVENOUS | @ 12:00:00 | NDC 43825010201

## 2012-05-04 MED FILL — OFIRMEV 1,000 MG/100 ML (10 MG/ML) INTRAVENOUS SOLUTION: 1000 mg/100 mL (10 mg/mL) | INTRAVENOUS | Qty: 100

## 2012-05-04 MED FILL — ZOSYN 3.375 GRAM INTRAVENOUS SOLUTION: 3.375 gram | INTRAVENOUS | Qty: 3.38

## 2012-05-04 MED FILL — METHIMAZOLE 5 MG TAB: 5 mg | ORAL | Qty: 1

## 2012-05-04 MED FILL — VANCOMYCIN IN 0.9 % SODIUM CHLORIDE 1.25 GRAM/250 ML IV: 1.25 gram/250 mL | INTRAVENOUS | Qty: 250

## 2012-05-04 MED FILL — MIDAZOLAM 1 MG/ML IJ SOLN: 1 mg/mL | INTRAMUSCULAR | Qty: 2

## 2012-05-04 MED FILL — SODIUM CHLORIDE 0.9 % INJECTION: INTRAMUSCULAR | Qty: 10

## 2012-05-04 MED FILL — HYDROCHLOROTHIAZIDE 25 MG TAB: 25 mg | ORAL | Qty: 1

## 2012-05-04 MED FILL — FENTANYL CITRATE (PF) 50 MCG/ML IJ SOLN: 50 mcg/mL | INTRAMUSCULAR | Qty: 5

## 2012-05-04 MED FILL — METRONIDAZOLE IN SODIUM CHLORIDE (ISO-OSM) 500 MG/100 ML IV PIGGY BACK: 500 mg/100 mL | INTRAVENOUS | Qty: 100

## 2012-05-04 MED FILL — ONDANSETRON (PF) 4 MG/2 ML INJECTION: 4 mg/2 mL | INTRAMUSCULAR | Qty: 2

## 2012-05-04 MED FILL — MORPHINE 10 MG/ML SYRINGE: 10 mg/mL | INTRAMUSCULAR | Qty: 1

## 2012-05-04 MED FILL — PROCHLORPERAZINE EDISYLATE 5 MG/ML INJECTION: 5 mg/mL | INTRAMUSCULAR | Qty: 2

## 2012-05-04 NOTE — Progress Notes (Signed)
Dr. Verne Grain ordered for the pt to be tested for c-diff.  Attempted to have patient placed in isolation.  Per nursing supervisor Alinda Money, we can keep her in a semi private room as long as they are using separate toilets.

## 2012-05-04 NOTE — Progress Notes (Signed)
Bedside shift change report given to Lynden Ang, Char[employer] fundraiser ([employer]) by Al Decant, RN  (offgoing nurse).  Report given with SBAR.

## 2012-05-04 NOTE — Progress Notes (Signed)
General Surgery Daily Progress Note    Admit Date: 04/27/2012  Post-Operative Day: 3 Days Post-Op from Procedure(s) with comments:  UNROOFING HEPATIC CYST  Laparascopic     Subjective:     Last 24 hrs: Pt says today is her best day yet.  Eating well, moving bowels, OOB in halls.  Had bump in Cr today to 1.98.  Vanc trough elevated yesterday and dose adjusted.    On lisinopril and on that med at home.   All ctx no growth so far from 11/1.    Objective:     Blood pressure 123/73, pulse 69, temperature 98 ??F (36.7 ??C), resp. rate 16, height 5' 4.5" (1.638 m), weight 189 lb 2.5 oz (85.8 kg), SpO2 96.00%.  Temp (24hrs), Avg:98.2 ??F (36.8 ??C), Min:97.5 ??F (36.4 ??C), Max:98.8 ??F (37.1 ??C)      _____________________  Physical Exam:     Alert and Oriented, x3, in no acute distress.  Cardiovascular: RRR, no peripheral edema  Lungs:CTAB   Abdomen: soft, nl bS, incisions are clean      Assessment:   Principal Problem:    Liver cyst (04/27/2012)    Active Problems:    HTN (hypertension) (04/27/2012)      Hyperthyroidism (04/27/2012)      UTI (lower urinary tract infection) (04/27/2012)            Plan:     Will d/c all abx.   Hold lisinopril until Cr normalizes  OOB in halls  Cont diet  Am labs    Data Review:    Recent Labs      05/04/12   0331  05/03/12   0507  05/02/12   0400   WBC  9.1  11.3*  13.0*   HGB  8.9*  9.2*  8.3*   HCT  27.8*  28.7*  25.6*   PLT  373  347  317     Recent Labs      05/04/12   0331  05/02/12   0400   NA  146*  140   K  4.0  3.6   CL  114*  107   CO2  23  27   GLU  101*  139*   BUN  19  10   CREA  1.98*  0.62   CA  8.1*  7.4*     No results found for this basename: AML, LPSE,  in the last 72 hours        ______________________  Medications:    Current Facility-Administered Medications   Medication Dose Route Frequency   ??? [COMPLETED] Vancomycin trough @1200  on 11/3  1 Each Other ONCE   ??? [COMPLETED] acetaminophen (OFIRMEV) infusion 650 mg  650 mg IntraVENous Q6H   ??? HYDROmorphone (DILAUDID) tablet 2  mg  2 mg Oral Q4H PRN   ??? prochlorperazine (COMPAZINE) with saline injection 10 mg  10 mg IntraVENous Q6H PRN   ??? [DISCONTINUED] vancomycin (VANCOCIN) 1250 mg in NS 250 ml infusion  1,250 mg IntraVENous Q16H   ??? metroNIDAZOLE (FLAGYL) IVPB premix 500 mg  500 mg IntraVENous Q8H   ??? dextrose 5% - 0.45% NaCl with KCl 20 mEq/L infusion  75 mL/hr IntraVENous CONTINUOUS   ??? [DISCONTINUED] vancomycin (VANCOCIN) 1500 mg in NS 250 ml infusion  1,500 mg IntraVENous Q12H   ??? piperacillin-tazobactam (ZOSYN) 3.375 g in 0.9% sodium chloride (MBP/ADV) 100 mL MBP  3.375 g IntraVENous Q6H   ??? Vancomycin- pharmacy to dose  1 Each  Other PRN   ??? [DISCONTINUED] acetaminophen (TYLENOL) tablet 650 mg  650 mg Oral Q6H PRN   ??? pantoprazole (PROTONIX) injection 40 mg  40 mg IntraVENous DAILY   ??? HYDROmorphone (PF) (DILAUDID) injection 1 mg  1 mg IntraVENous Q3H PRN   ??? atenolol (TENORMIN) tablet 25 mg  25 mg Oral QHS   ??? methimazole (TAPAZOLE) tablet 5 mg  5 mg Oral QHS   ??? ondansetron (ZOFRAN) injection 4 mg  4 mg IntraVENous Q4H PRN   ??? bisacodyl (DULCOLAX) suppository 10 mg  10 mg Rectal DAILY PRN   ??? docusate sodium (COLACE) capsule 100 mg  100 mg Oral BID   ??? hydrochlorothiazide (HYDRODIURIL) tablet 25 mg  25 mg Oral DAILY   ??? [DISCONTINUED] lisinopril (PRINIVIL, ZESTRIL) tablet 20 mg  20 mg Oral DAILY

## 2012-05-04 NOTE — Progress Notes (Signed)
Patient having yellowish loose stool, nausea and vomiting of undigested food. Spoke with Dr. Verne Grain, new order given for C-Diff culture and Clear liquid Diet over night.

## 2012-05-04 NOTE — Progress Notes (Signed)
Agree.  Hopefully home in the AM

## 2012-05-04 NOTE — Progress Notes (Signed)
I have read and agree with Kehinde's documentation.    KAMARI D PARRIS, RN

## 2012-05-05 LAB — CULTURE, WOUND W GRAM STAIN: Culture result:: NO GROWTH

## 2012-05-05 LAB — CBC WITH AUTOMATED DIFF
ABS. BASOPHILS: 0 10*3/uL (ref 0.0–0.1)
ABS. EOSINOPHILS: 0.2 10*3/uL (ref 0.0–0.4)
ABS. LYMPHOCYTES: 1.4 10*3/uL (ref 0.8–3.5)
ABS. MONOCYTES: 0.9 10*3/uL (ref 0.0–1.0)
ABS. NEUTROPHILS: 6.5 10*3/uL (ref 1.8–8.0)
BASOPHILS: 0 % (ref 0–1)
EOSINOPHILS: 2 % (ref 0–7)
HCT: 27 % — ABNORMAL LOW (ref 35.0–47.0)
HGB: 8.6 g/dL — ABNORMAL LOW (ref 11.5–16.0)
LYMPHOCYTES: 16 % (ref 12–49)
MCH: 25.8 PG — ABNORMAL LOW (ref 26.0–34.0)
MCHC: 31.9 g/dL (ref 30.0–36.5)
MCV: 81.1 FL (ref 80.0–99.0)
MONOCYTES: 10 % (ref 5–13)
NEUTROPHILS: 72 % (ref 32–75)
PLATELET: 377 10*3/uL (ref 150–400)
RBC: 3.33 M/uL — ABNORMAL LOW (ref 3.80–5.20)
RDW: 14.3 % (ref 11.5–14.5)
WBC: 9 10*3/uL (ref 3.6–11.0)

## 2012-05-05 LAB — METABOLIC PANEL, BASIC
Anion gap: 7 mmol/L (ref 5–15)
BUN/Creatinine ratio: 8 — ABNORMAL LOW (ref 12–20)
BUN: 16 MG/DL (ref 6–20)
CO2: 24 MMOL/L (ref 21–32)
Calcium: 8.1 MG/DL — ABNORMAL LOW (ref 8.5–10.1)
Chloride: 115 MMOL/L — ABNORMAL HIGH (ref 97–108)
Creatinine: 1.93 MG/DL — ABNORMAL HIGH (ref 0.45–1.15)
GFR est AA: 32 mL/min/{1.73_m2} — ABNORMAL LOW (ref >60)
GFR est non-AA: 26 mL/min/{1.73_m2} — ABNORMAL LOW (ref >60)
Glucose: 113 MG/DL — ABNORMAL HIGH (ref 65–100)
Potassium: 3.9 MMOL/L (ref 3.5–5.1)
Sodium: 146 MMOL/L — ABNORMAL HIGH (ref 136–145)

## 2012-05-05 LAB — CULTURE, ANAEROBIC: Culture result:: NO GROWTH

## 2012-05-05 LAB — C. DIFFICILE (DNA): C. difficile (DNA): NEGATIVE

## 2012-05-05 MED ADMIN — ondansetron (ZOFRAN) injection 4 mg: INTRAVENOUS | @ 03:00:00 | NDC 00409475503

## 2012-05-05 MED ADMIN — pantoprazole (PROTONIX) injection 40 mg: INTRAVENOUS | @ 14:00:00 | NDC 63323018610

## 2012-05-05 MED ADMIN — dextrose 5% - 0.45% NaCl with KCl 20 mEq/L infusion: INTRAVENOUS | @ 09:00:00 | NDC 00409790209

## 2012-05-05 MED ADMIN — hydrochlorothiazide (HYDRODIURIL) tablet 25 mg: ORAL | @ 15:00:00 | NDC 68084008611

## 2012-05-05 MED ADMIN — atenolol (TENORMIN) tablet 25 mg: ORAL | @ 03:00:00 | NDC 00904539261

## 2012-05-05 MED ADMIN — dextrose 5% - 0.45% NaCl with KCl 20 mEq/L infusion: INTRAVENOUS | @ 01:00:00 | NDC 00409790209

## 2012-05-05 MED ADMIN — methimazole (TAPAZOLE) tablet 5 mg: ORAL | @ 03:00:00 | NDC 68084027511

## 2012-05-05 MED ADMIN — sodium chloride (NS) 0.9 % flush: @ 03:00:00 | NDC 87701099893

## 2012-05-05 MED FILL — BD POSIFLUSH NORMAL SALINE 0.9 % INJECTION SYRINGE: INTRAMUSCULAR | Qty: 10

## 2012-05-05 MED FILL — METHIMAZOLE 5 MG TAB: 5 mg | ORAL | Qty: 1

## 2012-05-05 MED FILL — ONDANSETRON (PF) 4 MG/2 ML INJECTION: 4 mg/2 mL | INTRAMUSCULAR | Qty: 2

## 2012-05-05 MED FILL — D5-1/2 NS & POTASSIUM CHLORIDE 20 MEQ/L IV: 20 mEq/L | INTRAVENOUS | Qty: 1000

## 2012-05-05 MED FILL — SODIUM CHLORIDE 0.9 % INJECTION: INTRAMUSCULAR | Qty: 10

## 2012-05-05 MED FILL — HYDROCHLOROTHIAZIDE 25 MG TAB: 25 mg | ORAL | Qty: 1

## 2012-05-05 MED FILL — DOCUSATE SODIUM 100 MG CAP: 100 mg | ORAL | Qty: 1

## 2012-05-05 MED FILL — ATENOLOL 25 MG TAB: 25 mg | ORAL | Qty: 1

## 2012-05-05 NOTE — Progress Notes (Signed)
General Surgery Daily Progress Note    Admit Date: 04/27/2012  Post-Operative Day: 4 Days Post-Op from Procedure(s) with comments:  UNROOFING HEPATIC CYST  Laparascopic     Subjective:     Last 24 hrs: Patient seen sitting on side of bed eating. She states she is doing great this morning. She had a little bit of nausea yesterday after dinner but she is tolerating her breakfast well. She is passing gas and has had a BM. She denies pain entirely. She is up ambulating. She is hoping to be able to go home today.        Objective:     Blood pressure 142/66, pulse 74, temperature 98.8 ??F (37.1 ??C), resp. rate 16, height 5' 4.5" (1.638 m), weight 85.8 kg (189 lb 2.5 oz), SpO2 94.00%.  Temp (24hrs), Avg:98.2 ??F (36.8 ??C), Min:97.2 ??F (36.2 ??C), Max:98.8 ??F (37.1 ??C)      _____________________  Physical Exam:     Alert and Oriented, pleasant, in no acute distress.  Cardiovascular: RRR, no MRG, no peripheral edema, peripheral pulses palpable  Lungs:CTAB, unlabored, room air  Abdomen: soft, bowel sounds positive. Incision site clean, no evidence of infection.   Neuro: She is neurologically intact  Skin: pink, warm, dry      Assessment:   Principal Problem:    Liver cyst (04/27/2012)    Active Problems:    HTN (hypertension) (04/27/2012)      Hyperthyroidism (04/27/2012)      UTI (lower urinary tract infection) (04/27/2012)            Plan:     Discontinue IVF  [place] IV  Continue diet, monitor for further nausea  Patient may discharge home today after breakfast if she tolerates meal.        Data Review:    Recent Labs      05/05/12   0455  05/04/12   0331  05/03/12   0507   WBC  9.0  9.1  11.3*   HGB  8.6*  8.9*  9.2*   HCT  27.0*  27.8*  28.7*   PLT  377  373  347     Recent Labs      05/05/12   0455  05/04/12   0331   NA  146*  146*   K  3.9  4.0   CL  115*  114*   CO2  24  23   GLU  113*  101*   BUN  16  19   CREA  1.93*  1.98*   CA  8.1*  8.1*     No results found for this basename: AML, LPSE,  in the last 72 hours         ______________________  Medications:    Current Facility-Administered Medications   Medication Dose Route Frequency   ??? [COMPLETED] sodium chloride (NS) 0.9 % flush       ??? dextrose 5% - 0.45% NaCl with KCl 20 mEq/L infusion  100 mL/hr IntraVENous CONTINUOUS   ??? HYDROmorphone (DILAUDID) tablet 2 mg  2 mg Oral Q4H PRN   ??? prochlorperazine (COMPAZINE) with saline injection 10 mg  10 mg IntraVENous Q6H PRN   ??? pantoprazole (PROTONIX) injection 40 mg  40 mg IntraVENous DAILY   ??? HYDROmorphone (PF) (DILAUDID) injection 1 mg  1 mg IntraVENous Q3H PRN   ??? atenolol (TENORMIN) tablet 25 mg  25 mg Oral QHS   ??? methimazole (TAPAZOLE) tablet 5 mg  5 mg  Oral QHS   ??? ondansetron (ZOFRAN) injection 4 mg  4 mg IntraVENous Q4H PRN   ??? bisacodyl (DULCOLAX) suppository 10 mg  10 mg Rectal DAILY PRN   ??? docusate sodium (COLACE) capsule 100 mg  100 mg Oral BID   ??? hydrochlorothiazide (HYDRODIURIL) tablet 25 mg  25 mg Oral DAILY

## 2012-05-05 NOTE — Discharge Summary (Addendum)
Physician Discharge Summary     Patient ID:  Sandra Walsh  [id number]  66 y.o.  [birth year]    Admit Date: 04/27/2012    Discharge Date: 05/05/2012    Admission Diagnoses: Liver cyst  HIAPATIC CYST    Discharge Diagnoses:  Principal Problem:    Liver cyst (04/27/2012)    Active Problems:    HTN (hypertension) (04/27/2012)      Hyperthyroidism (04/27/2012)      UTI (lower urinary tract infection) (04/27/2012)         Admission Condition: Fair    Discharge Condition: Good    Last Procedure: Procedure(s) with comments:  UNROOFING HEPATIC CYST  Laparascopic      Hospital Course:   Pt had a fever postoperatively and was started on zosyn and flagyl. No more occurrence of fever.  Pt was started on clear liquids and advanced as bowel fxn returned. She was assisted w/ ambulation and progressed well.  Her WBC normalized and the abx were stopped.  She cont to progress.  She was discharged w/ f/u in 2 weeks w/ Dr Celedonio Savage, NP    Consults:internal medicine    Disposition: Home    Patient Instructions:   Current Discharge Medication List      START taking these medications    Details   HYDROcodone-acetaminophen (NORCO) 5-325 mg per tablet Take 2 Tabs by mouth every four (4) hours as needed for Pain.  Qty: 30 Tab, Refills: 1         CONTINUE these medications which have NOT CHANGED    Details   methimazole (TAPAZOLE) 5 mg tablet Take 5 mg by mouth nightly.      atenolol (TENORMIN) 25 mg tablet Take 25 mg by mouth nightly.      lisinopril-hydrochlorothiazide (PRINZIDE, ZESTORETIC) 20-25 mg per tablet Take  by mouth daily.           Activity: No lifting, pushing or pulling anything heavier than 20 lbs x 2 weeks. Walking as tolerated.  No driving while you are taking narcotics.  Diet: Regular Diet.  If you have cramps or nausea, try eating smaller light meals more frequently.  You may find it helpful to take an over-the-counter stool softener such as colace if you feel constipated.    Wound Care: Keep clean and dry.  You  may shower, but no immersion in standing water (bath tubs, swimming pools) x 2 weeks. Pat area with soft towel to dry.     Follow-up with Dr. Jimmey Ralph in 10-14 days.  Call office at [phone] to schedule appointment.  We are located at [facility] in the [employer], [address].       Signed:  Alphonzo Severance, NP  05/05/2012  11:44 AM

## 2012-05-07 LAB — CULTURE, BLOOD, PAIRED: Culture result:: NO GROWTH

## 2012-05-11 NOTE — Progress Notes (Signed)
Chart reviewed by nurse today to complete [employer] audit.

## 2012-05-18 NOTE — Progress Notes (Signed)
Subjective:   Sandra Walsh is a 66 y.o. female presents for postop care 18 days following unroofing of hepatic cyst by Dr. Clinton Sawyer. Pt had been treating with abx for UTI while inpatient.    Appetite is good. Eating a regular diet without difficulty.   Bowel movements are regular.  The patient is not having any pain..  Denies fever, nausea, shortness of breath, chest pain, redness at incision site, vomiting and diarrhea. Pt denies dysuria, frequency, hesitation. Pt would like to join a gym today and begin volunteering at the hospital.      Objective:     BP 134/70   Pulse 72   Temp(Src) 98.6 ??F (37 ??C)   Ht 5' 4.5" (1.638 m)   Wt 189 lb (85.73 kg)   BMI 31.95 kg/m2    General:  alert, no distress   Abdomen: soft, bowel sounds active, non-tender   Incision:   healing well, no drainage, no erythema, no seroma, no swelling, no dehiscence, incisions well approximated   Heart: regular rate and rhythm, S1, S2 normal, no murmur, click, rub or gallop   Lungs: clear to auscultation bilaterally     Microbiology: No organisms grown; few WBCs    Assessment:     S/P unroofing hepatic cyst.   Doing well postoperatively.    Plan:     1. Pt is to increase activities as tolerated. May join the gym and slowly start increasing activities.  Return to office or PCP if fever, dysuria, urinary hesitation or frequency  2. Follow-up prn  3. Wound care discussed - incisions to soften.

## 2014-01-07 LAB — VANCOMYCIN, TROUGH: Vancomycin,trough: 25.9 ug/mL — CR (ref 5.0–10.0)

## 2015-10-03 ENCOUNTER — Inpatient Hospital Stay: Admit: 2015-10-20 | Payer: MEDICARE

## 2015-10-03 ENCOUNTER — Ambulatory Visit: Admit: 2015-10-03 | Discharge: 2015-10-03 | Payer: MEDICARE | Attending: Internal Medicine

## 2015-10-03 DIAGNOSIS — I10 Essential (primary) hypertension: Secondary | ICD-10-CM

## 2015-10-03 DIAGNOSIS — Z7689 Persons encountering health services in other specified circumstances: Secondary | ICD-10-CM

## 2015-10-03 MED ORDER — ATENOLOL 25 MG TAB
25 mg | ORAL_TABLET | Freq: Every evening | ORAL | 3 refills | Status: DC
Start: 2015-10-03 — End: 2016-04-14

## 2015-10-03 MED ORDER — LISINOPRIL-HYDROCHLOROTHIAZIDE 20 MG-25 MG TAB
20-25 mg | ORAL_TABLET | Freq: Every day | ORAL | 3 refills | Status: DC
Start: 2015-10-03 — End: 2016-03-05

## 2015-10-03 NOTE — Progress Notes (Signed)
New Patient Evaluation    Sandra Walsh is a 70 y.o. female.  They are here to establish care with the group and me as a primary care provider.    she has seen Dr. Cecile Walsh in the past.  The last visit was a year ago.      She has a history of hypertension.  Normally she has pressure in the 130's during the day.   She has been on two medications for some time.  The patient denies any chest pain.  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      She has had a diagnosis of hyperthyroidism in the past.  She has been on methimazole some years ago.  She was supposed to follow up but had not had a repeat testing.  She has seen Dr. Susy Walsh in the past.       She has also had a history of a liver cyst in the past.  She was opened with surgery in 2013--noted to be a benign cyst.  14x12x16cm.  Unroofed by Dr. Jimmey Walsh    She refuses a mammogram and Pap smear  She also refuses to have a colonoscopy.         Patient Active Problem List    Diagnosis Date Noted   ??? Liver cyst 04/27/2012   ??? HTN (hypertension) 04/27/2012   ??? Hyperthyroidism 04/27/2012   ??? UTI (lower urinary tract infection) 04/27/2012     Current Outpatient Prescriptions   Medication Sig Dispense Refill   ??? VIT A/VIT C/VIT E/ZINC/COPPER (ICAPS AREDS PO) Take  by mouth.     ??? OTHER      ??? b complex vitamins (B COMPLEX 1) tablet Take 1 Tab by mouth daily.     ??? atenolol (TENORMIN) 25 mg tablet Take 25 mg by mouth nightly.     ??? lisinopril-hydrochlorothiazide (PRINZIDE, ZESTORETIC) 20-25 mg per tablet Take  by mouth daily.     ??? HYDROcodone-acetaminophen (NORCO) 5-325 mg per tablet Take 2 Tabs by mouth every four (4) hours as needed for Pain. 30 Tab 1   ??? methimazole (TAPAZOLE) 5 mg tablet Take 5 mg by mouth nightly.       No Known Allergies  Past Medical History:   Diagnosis Date   ??? Hypertension    ??? Hyperthyroidism     on Methimazole     Past Surgical History:   Procedure Laterality Date   ??? HX GYN       Hysteroscopy, fibroid removal   ??? HX OTHER SURGICAL  05-01-12    lap. unroofing of hepatic cyst with culture-[facility]-Dr. Dorette GrateG. Walsh     No family history on file.  Social History   Substance Use Topics   ??? Smoking status: Never Smoker   ??? Smokeless tobacco: Not on file   ??? Alcohol use No        Health Maintenance   Topic Date Due   ??? Hepatitis C Screening  1946/03/12   ??? DTaP/Tdap/Td series (1 - Tdap) 08/28/1967   ??? BREAST CANCER SCRN MAMMOGRAM  08/27/1996   ??? FOBT Q 1 YEAR AGE 54-75  08/27/1996   ??? ZOSTER VACCINE AGE 5>  08/27/2006   ??? GLAUCOMA SCREENING Q2Y  08/28/2011   ??? OSTEOPOROSIS SCREENING (DEXA)  08/28/2011   ??? MEDICARE YEARLY EXAM  08/28/2011   ??? Pneumococcal 65+ Low/Medium Risk (2 of 2 - PCV13) 04/28/2013   ??? INFLUENZA AGE 32 TO  ADULT  01/30/2015       Review of Systems   Constitutional: Negative.    HENT: Negative.    Respiratory: Negative for cough.    Cardiovascular: Negative for chest pain and palpitations.   Gastrointestinal: Negative.    Musculoskeletal: Negative.    All other systems reviewed and are negative.        Visit Vitals   ??? BP 150/72 (BP 1 Location: Right arm, BP Patient Position: Sitting)   ??? Pulse 64   ??? Temp 98.4 ??F (36.9 ??C) (Oral)   ??? Resp 19   ??? Ht 5' 3.47" (1.612 m)   ??? Wt 214 lb 6.4 oz (97.3 kg)   ??? SpO2 96%   ??? BMI 37.42 kg/m2       Physical Exam   Constitutional: She is well-developed, well-nourished, and in no distress.   Cardiovascular: Normal rate and regular rhythm.    Pulmonary/Chest: Effort normal and breath sounds normal.           ASSESSMENT/PLAN    Sandra Walsh was seen today for establish care.    Diagnoses and all orders for this visit:    Encounter to establish care    Family history of diabetes mellitus (DM)  -     HEMOGLOBIN A1C WITH EAG    Essential hypertension  -     CBC WITH AUTOMATED DIFF  -     LIPID PANEL  -     METABOLIC PANEL, COMPREHENSIVE    Hyperthyroidism  -     TSH 3RD GENERATION  -     T4, FREE      Follow-up Disposition:   Return in about 3 months (around 01/02/2016) for Full Physical - 30 minutes appointment.    -Discussed with the patient to continue the current plan of care.  We will obtain baseline labwork and determine if any adjustments need to be done.  We will also await the records of the previous PCP to ascertain further details of the patient's history. The patient agrees with and understands the plan of care. All questions have been answered.

## 2015-10-03 NOTE — Patient Instructions (Signed)
High Blood Pressure: Care Instructions  Your Care Instructions  If your blood pressure is usually above 140/90, you have high blood pressure, or hypertension. That means the top number is 140 or higher or the bottom number is 90 or higher, or both.  Despite what a lot of people think, high blood pressure usually doesn't cause headaches or make you feel dizzy or lightheaded. It usually has no symptoms. But it does increase your risk for heart attack, stroke, and kidney or eye damage. The higher your blood pressure, the more your risk increases.  Your doctor will give you a goal for your blood pressure. Your goal will be based on your health and your age. An example of a goal is to keep your blood pressure below 140/90.  Lifestyle changes, such as eating healthy and being active, are always important to help lower blood pressure. You might also take medicine to reach your blood pressure goal.  Follow-up care is a key part of your treatment and safety. Be sure to make and go to all appointments, and call your doctor if you are having problems. It's also a good idea to know your test results and keep a list of the medicines you take.  How can you care for yourself at home?  Medical treatment  ?? If you stop taking your medicine, your blood pressure will go back up. You may take one or more types of medicine to lower your blood pressure. Be safe with medicines. Take your medicine exactly as prescribed. Call your doctor if you think you are having a problem with your medicine.  ?? Talk to your doctor before you start taking aspirin every day. Aspirin can help certain people lower their risk of a heart attack or stroke. But taking aspirin isn't right for everyone, because it can cause serious bleeding.  ?? See your doctor regularly. You may need to see the doctor more often at first or until your blood pressure comes down.  ?? If you are taking blood pressure medicine, talk to your doctor before  you take decongestants or anti-inflammatory medicine, such as ibuprofen. Some of these medicines can raise blood pressure.  ?? Learn how to check your blood pressure at home.  Lifestyle changes  ?? Stay at a healthy weight. This is especially important if you put on weight around the waist. Losing even 10 pounds can help you lower your blood pressure.  ?? If your doctor recommends it, get more exercise. Walking is a good choice. Bit by bit, increase the amount you walk every day. Try for at least 30 minutes on most days of the week. You also may want to swim, bike, or do other activities.  ?? Avoid or limit alcohol. Talk to your doctor about whether you can drink any alcohol.  ?? Try to limit how much sodium you eat to less than 2,300 milligrams (mg) a day. Your doctor may ask you to try to eat less than 1,500 mg a day.  ?? Eat plenty of fruits (such as bananas and oranges), vegetables, legumes, whole grains, and low-fat dairy products.  ?? Lower the amount of saturated fat in your diet. Saturated fat is found in animal products such as milk, cheese, and meat. Limiting these foods may help you lose weight and also lower your risk for heart disease.  ?? Do not smoke. Smoking increases your risk for heart attack and stroke. If you need help quitting, talk to your doctor about stop-smoking programs and medicines.   These can increase your chances of quitting for good.  When should you call for help?  Call 911 anytime you think you may need emergency care. This may mean having symptoms that suggest that your blood pressure is causing a serious heart or blood vessel problem. Your blood pressure may be over 180/110.  For example, call 911 if:  ?? You have symptoms of a heart attack. These may include:  ?? Chest pain or pressure, or a strange feeling in the chest.  ?? Sweating.  ?? Shortness of breath.  ?? Nausea or vomiting.  ?? Pain, pressure, or a strange feeling in the back, neck, jaw, or upper  belly or in one or both shoulders or arms.  ?? Lightheadedness or sudden weakness.  ?? A fast or irregular heartbeat.  ?? You have symptoms of a stroke. These may include:  ?? Sudden numbness, tingling, weakness, or loss of movement in your face, arm, or leg, especially on only one side of your body.  ?? Sudden vision changes.  ?? Sudden trouble speaking.  ?? Sudden confusion or trouble understanding simple statements.  ?? Sudden problems with walking or balance.  ?? A sudden, severe headache that is different from past headaches.  ?? You have severe back or belly pain.  Do not wait until your blood pressure comes down on its own. Get help right away.  Call your doctor now or seek immediate care if:  ?? Your blood pressure is much higher than normal (such as 180/110 or higher), but you don't have symptoms.  ?? You think high blood pressure is causing symptoms, such as:  ?? Severe headache.  ?? Blurry vision.  Watch closely for changes in your health, and be sure to contact your doctor if:  ?? Your blood pressure measures 140/90 or higher at least 2 times. That means the top number is 140 or higher or the bottom number is 90 or higher, or both.  ?? You think you may be having side effects from your blood pressure medicine.  ?? Your blood pressure is usually normal, but it goes above normal at least 2 times.  Where can you learn more?  Go to http://www.healthwise.net/[employer]Help[employer]ctions.  Enter X567 in the search box to learn more about "High Blood Pressure: Care Instructions."  Current as of: February 06, 2015  Content Version: 11.2  ?? 2006-2017 [employer]wise[employer]. Care instructions adapted under license by [employer] (which disclaims liability or warranty for this information). If you have questions about a medical condition or this instruction, always ask your healthcare professional. Healthwise, Incorporated disclaims any warranty or liability for your use of this information.

## 2015-10-03 NOTE — Telephone Encounter (Signed)
These will be new rx's with Dr. Hart RochesterLawson.

## 2015-10-04 LAB — METABOLIC PANEL, COMPREHENSIVE
A-G Ratio: 1.5 (ref 1.2–2.2)
ALT (SGPT): 17 IU/L (ref 0–32)
AST (SGOT): 62 IU/L — ABNORMAL HIGH (ref 0–40)
Albumin: 4.2 g/dL (ref 3.6–4.8)
Alk. phosphatase: 84 IU/L (ref 39–117)
BUN/Creatinine ratio: 29 — ABNORMAL HIGH (ref 12–28)
BUN: 20 mg/dL (ref 8–27)
Bilirubin, total: 0.3 mg/dL (ref 0.0–1.2)
CO2: 23 mmol/L (ref 18–29)
Calcium: 9.3 mg/dL (ref 8.7–10.3)
Chloride: 96 mmol/L (ref 96–106)
Creatinine: 0.68 mg/dL (ref 0.57–1.00)
GFR est AA: 103 mL/min/{1.73_m2} (ref >59)
GFR est non-AA: 90 mL/min/{1.73_m2} (ref >59)
GLOBULIN, TOTAL: 2.8 g/dL (ref 1.5–4.5)
Glucose: 77 mg/dL (ref 65–99)
Potassium: 5.6 mmol/L — ABNORMAL HIGH (ref 3.5–5.2)
Protein, total: 7 g/dL (ref 6.0–8.5)
Sodium: 140 mmol/L (ref 134–144)

## 2015-10-04 LAB — CBC WITH AUTOMATED DIFF
ABS. BASOPHILS: 0 10*3/uL (ref 0.0–0.2)
ABS. EOSINOPHILS: 0.1 10*3/uL (ref 0.0–0.4)
ABS. IMM. GRANS.: 0 10*3/uL (ref 0.0–0.1)
ABS. MONOCYTES: 0.7 10*3/uL (ref 0.1–0.9)
ABS. NEUTROPHILS: 4.1 10*3/uL (ref 1.4–7.0)
Abs Lymphocytes: 2 10*3/uL (ref 0.7–3.1)
BASOPHILS: 0 %
EOSINOPHILS: 2 %
HCT: 38.3 % (ref 34.0–46.6)
HGB: 12.4 g/dL (ref 11.1–15.9)
IMMATURE GRANULOCYTES: 0 %
Lymphocytes: 29 %
MCH: 25.8 pg — ABNORMAL LOW (ref 26.6–33.0)
MCHC: 32.4 g/dL (ref 31.5–35.7)
MCV: 80 fL (ref 79–97)
MONOCYTES: 10 %
NEUTROPHILS: 59 %
PLATELET: 244 10*3/uL (ref 150–379)
RBC: 4.8 x10E6/uL (ref 3.77–5.28)
RDW: 15 % (ref 12.3–15.4)
WBC: 6.9 10*3/uL (ref 3.4–10.8)

## 2015-10-04 LAB — LIPID PANEL
Cholesterol, total: 175 mg/dL (ref 100–199)
HDL Cholesterol: 47 mg/dL (ref >39)
LDL, calculated: 101 mg/dL — ABNORMAL HIGH (ref 0–99)
Triglyceride: 135 mg/dL (ref 0–149)
VLDL, calculated: 27 mg/dL (ref 5–40)

## 2015-10-04 LAB — HEMOGLOBIN A1C WITH EAG
Estimated average glucose: 134 mg/dL
Hemoglobin A1c: 6.3 % — ABNORMAL HIGH (ref 4.8–5.6)

## 2015-10-04 LAB — TSH 3RD GENERATION: TSH: <0.006 u[IU]/mL — ABNORMAL LOW (ref 0.450–4.500)

## 2015-10-04 LAB — T4, FREE: T4, Free: 1.82 ng/dL — ABNORMAL HIGH (ref 0.82–1.77)

## 2015-10-10 ENCOUNTER — Encounter

## 2015-10-10 NOTE — Progress Notes (Signed)
Pt. Notified Kt is elevated, verified not taking any and instructed to return tomorrow for repeat lab. If remains elevated will need to see a partner, Dr Hart RochesterLawson is on vacation.

## 2015-10-12 LAB — POTASSIUM: Potassium: 4.1 mmol/L (ref 3.5–5.2)

## 2015-10-12 NOTE — Progress Notes (Signed)
Pt's husband stated she saw the normal Kt on my chart.

## 2015-11-29 NOTE — Progress Notes (Signed)
Labs have been sent in to dr Castellucci's office per request

## 2016-01-16 ENCOUNTER — Encounter: Attending: Internal Medicine

## 2016-02-12 NOTE — Telephone Encounter (Signed)
The patient would like to discuss her medication Atenolol the patient said  the pharmacy has sent a fax regarding scripts

## 2016-02-12 NOTE — Telephone Encounter (Signed)
Received fax from patient pharmacy stating they are on back order of Atenolol 25 mg , authorized 50mg  take half a tab daily.

## 2016-02-27 ENCOUNTER — Encounter: Attending: Internal Medicine

## 2016-03-05 MED ORDER — LISINOPRIL-HYDROCHLOROTHIAZIDE 20 MG-25 MG TAB
20-25 mg | ORAL_TABLET | ORAL | 3 refills | Status: DC
Start: 2016-03-05 — End: 2016-05-10

## 2016-04-03 ENCOUNTER — Encounter: Attending: Internal Medicine

## 2016-04-17 MED ORDER — ATENOLOL 25 MG TAB
25 mg | ORAL_TABLET | ORAL | 1 refills | Status: DC
Start: 2016-04-17 — End: 2016-05-10

## 2016-05-10 MED ORDER — ATENOLOL 25 MG TAB
25 mg | ORAL_TABLET | ORAL | 3 refills | Status: DC
Start: 2016-05-10 — End: 2016-05-10

## 2016-05-10 MED ORDER — ATENOLOL 25 MG TAB
25 mg | ORAL_TABLET | ORAL | 3 refills | Status: DC
Start: 2016-05-10 — End: 2016-08-13

## 2016-05-10 MED ORDER — LISINOPRIL-HYDROCHLOROTHIAZIDE 20 MG-25 MG TAB
20-25 mg | ORAL_TABLET | ORAL | 3 refills | Status: DC
Start: 2016-05-10 — End: 2016-06-10

## 2016-05-10 NOTE — Telephone Encounter (Signed)
Pt will need refills before Jan appt.  Pt wants to pick up at Pharm today if possible.  Pt [phone]

## 2016-06-10 MED ORDER — LISINOPRIL-HYDROCHLOROTHIAZIDE 20 MG-25 MG TAB
20-25 mg | ORAL_TABLET | ORAL | 0 refills | Status: DC
Start: 2016-06-10 — End: 2016-08-13

## 2016-06-10 NOTE — Telephone Encounter (Signed)
Patient requested a prescription for Lisinopril/ HCTZ 20-25 mg - 90 day supply ,  No refills until seen

## 2016-07-08 ENCOUNTER — Encounter: Attending: Internal Medicine

## 2016-08-13 ENCOUNTER — Ambulatory Visit: Admit: 2016-08-13 | Discharge: 2016-08-13 | Payer: MEDICARE | Attending: Internal Medicine

## 2016-08-13 DIAGNOSIS — Z0000 Encounter for general adult medical examination without abnormal findings: Secondary | ICD-10-CM

## 2016-08-13 MED ORDER — VARICELLA-ZOSTER GLYCOE VACC-AS01B ADJ(PF) 50 MCG/0.5 ML IM SUSPENSION
50 mcg/0.5 mL | Freq: Once | INTRAMUSCULAR | 1 refills | Status: AC
Start: 2016-08-13 — End: 2016-08-13

## 2016-08-13 MED ORDER — ATENOLOL 25 MG TAB
25 mg | ORAL_TABLET | ORAL | 1 refills | Status: DC
Start: 2016-08-13 — End: 2017-02-03

## 2016-08-13 MED ORDER — DIPHTH,PERTUS(ACEL)TETANUS VAC(PF) 2 LF-(5-3-5MCG)-5 LF/0.5 ML IM SUSP
2 Lf-(.5-5-3-5 mcg)-5Lf/0.5 mL | INJECTION | Freq: Once | INTRAMUSCULAR | 0 refills | Status: AC
Start: 2016-08-13 — End: 2016-08-13

## 2016-08-13 MED ORDER — LISINOPRIL-HYDROCHLOROTHIAZIDE 20 MG-25 MG TAB
20-25 mg | ORAL_TABLET | ORAL | 1 refills | Status: DC
Start: 2016-08-13 — End: 2016-10-17

## 2016-08-13 NOTE — ACP (Advance Care Planning) (Signed)
End of life planning discussed with patient.  Patient states that they do not have an advance care plan at this time; pt declined the information.

## 2016-08-13 NOTE — ACP (Advance Care Planning) (Signed)
End of life planning discussed with patient.  Patient states that they do not have an advance care plan at this time; pt declined the information.

## 2016-08-14 NOTE — Progress Notes (Signed)
Last 2 notes and any tests have been requested from Dr Lucent TechnologiesCatellucci's office fax # [phone]

## 2016-08-15 NOTE — Patient Instructions (Signed)
Medicare Wellness Visit, Female    The best way to live healthy is to have a healthy lifestyle by eating a well-balanced diet, exercising regularly, limiting alcohol and stopping smoking.    Regular physical exams and screening tests are another way to keep healthy. Preventive exams provided by your health care provider can find health problems before they become diseases or illnesses. Preventive services including immunizations, screening tests, monitoring and exams can help you take care of your own health.    All people over age 70 should have a pneumovax  and and a prevnar shot to prevent pneumonia. These are once in a lifetime unless you and your provider decide differently.    All people over 65 should have a yearly flu shot and a tetanus vaccine every 10 years.    A bone mass density to screen for osteoporosis or thinning of the bones should be done every 2 years after 65.    Screening for diabetes mellitus with a blood sugar test should be done every year.    Glaucoma is a disease of the eye due to increased ocular pressure that can lead to blindness and it should be done every year by an eye professional.    Cardiovascular screening tests that check for elevated lipids (fatty part of blood) which can lead to heart disease and strokes should be done every 5 years.    Colorectal screening that evaluates for blood or polyps in your colon should be done yearly as a stool test or every five years as a flexible sigmoidoscope or every 10 years as a colonoscopy up to age 70.    Breast cancer screening with a mammogram is recommended biennially  for women age 70-74.    Screening for cervical cancer with a pap smear and pelvic exam is recommended for women after age 70 years every 2 years up to age 70 or when the provider and patient decide to stop.If there is a history of cervical abnormalities or other increased risk for cancer then the test is recommended yearly.     Hepatitis C screening is also recommended for anyone born between [birth year] through 1965.    A shingles vaccine is also recommended once in a lifetime after age 70.    Your Medicare Wellness Exam is recommended annually.    Here is a list of your current Health Maintenance items with a due date:  Health Maintenance Due   Topic Date Due   ??? Hepatitis C Test  September 22, 1945   ??? DTaP/Tdap/Td  (1 - Tdap) 08/28/1967   ??? Stool testing for trace blood  08/27/1996   ??? Shingles Vaccine  06/26/2006   ??? Glaucoma Screening   08/28/2011

## 2016-08-15 NOTE — Progress Notes (Signed)
This is a Subsequent Medicare Annual Wellness Exam (AWV) (Performed 12 months after IPPE or effective date of Medicare Part B enrollment)    I have reviewed the patient's medical history in detail and updated the computerized patient record.     The patient reports feeling well today.  She has no acute issues.  She presents with her daughter.  Reviewed health maintenance and related needs.  She is stable on her medications and tolerant of them all.      History     Past Medical History:   Diagnosis Date   ??? Hypertension    ??? Hyperthyroidism     on Methimazole      Past Surgical History:   Procedure Laterality Date   ??? HX GYN      Hysteroscopy, fibroid removal   ??? HX OTHER SURGICAL  05-01-12    lap. unroofing of hepatic cyst with culture-[facility]-Dr. Dorette Grate     Current Outpatient Prescriptions   Medication Sig Dispense Refill   ??? lisinopril-hydroCHLOROthiazide (PRINZIDE, ZESTORETIC) 20-25 mg per tablet TAKE ONE TABLET BY MOUTH ONCE DAILY 90 Tab 1   ??? atenolol (TENORMIN) 25 mg tablet TAKE 1 TABLET BY MOUTH EVERY EVENING 90 Tab 1   ??? VIT A/VIT C/VIT E/ZINC/COPPER (ICAPS AREDS PO) Take  by mouth.     ??? OTHER      ??? b complex vitamins (B COMPLEX 1) tablet Take 1 Tab by mouth daily.     ??? HYDROcodone-acetaminophen (NORCO) 5-325 mg per tablet Take 2 Tabs by mouth every four (4) hours as needed for Pain. 30 Tab 1   ??? methimazole (TAPAZOLE) 5 mg tablet Take 5 mg by mouth nightly.       No Known Allergies  Family History   Problem Relation Age of Onset   ??? Family history unknown: Yes     Social History   Substance Use Topics   ??? Smoking status: Never Smoker   ??? Smokeless tobacco: Never Used   ??? Alcohol use No     Patient Active Problem List   Diagnosis Code   ??? Liver cyst K76.89   ??? HTN (hypertension) I10   ??? Hyperthyroidism E05.90   ??? UTI (lower urinary tract infection) N39.0       Depression Risk Factor Screening:     PHQ over the last two weeks 08/13/2016   Little interest or pleasure in doing things Not at all    Feeling down, depressed or hopeless Not at all   Total Score PHQ 2 0     Alcohol Risk Factor Screening:   You do not drink alcohol or very rarely.    Functional Ability and Level of Safety:   Hearing Loss  Hearing is good.    Activities of Daily Living  The home contains: no safety equipment.  Patient does total self care    Fall Risk  Fall Risk Assessment, last 12 mths 08/13/2016   Able to walk? Yes   Fall in past 12 months? No       Abuse Screen  Patient is not abused    Cognitive Screening   Evaluation of Cognitive Function:  Has your family/caregiver stated any concerns about your memory: no  Normal    Patient Care Team   Patient Care Team:  Denice Bors, MD as PCP - General (Internal Medicine)    Assessment/Plan   Education and counseling provided:  Are appropriate based on today's review and evaluation    Diagnoses  and all orders for this visit:    1. Medicare annual wellness visit, subsequent    2. Essential hypertension  -     lisinopril-hydroCHLOROthiazide (PRINZIDE, ZESTORETIC) 20-25 mg per tablet; TAKE ONE TABLET BY MOUTH ONCE DAILY  -     atenolol (TENORMIN) 25 mg tablet; TAKE 1 TABLET BY MOUTH EVERY EVENING  -     LIPID PANEL  -     METABOLIC PANEL, COMPREHENSIVE  -     CBC WITH AUTOMATED DIFF    3. Hyperthyroidism    4. Need for Tdap vaccination  -     diph,Pertuss,Acell,,Tet Vac-PF (ADACEL) 2 Lf-(2.5-5-3-5 mcg)-5Lf/0.5 mL susp; 0.5 mL by IntraMUSCular route once for 1 dose.    5. Need for shingles vaccine  -     varicella-zoster recombinant, PF, (SHINGRIX, PF,) 50 mcg/0.5 mL susr injection; 0.5 mL by IntraMUSCular route once for 1 dose. Second vaccine after 2 months    6. Prediabetes  -     HEMOGLOBIN A1C WITH EAG        Health Maintenance Due   Topic Date Due   ??? Hepatitis C Screening  [birth year]   ??? DTaP/Tdap/Td series (1 - Tdap) 08/28/1967   ??? FOBT Q 1 YEAR AGE 35-75  08/27/1996   ??? ZOSTER VACCINE AGE 61>  06/26/2006   ??? GLAUCOMA SCREENING Q2Y  08/28/2011

## 2016-10-17 ENCOUNTER — Encounter

## 2016-10-17 MED ORDER — LISINOPRIL-HYDROCHLOROTHIAZIDE 20 MG-25 MG TAB
20-25 mg | ORAL_TABLET | ORAL | 1 refills | Status: DC
Start: 2016-10-17 — End: 2017-08-11

## 2016-10-17 NOTE — Telephone Encounter (Signed)
Fax request received from [facility] requesting refill for Lisinopril /  hctz 20-25 mg for a 90 day supply , next appt in August

## 2017-02-03 ENCOUNTER — Encounter

## 2017-02-03 MED ORDER — ATENOLOL 25 MG TAB
25 mg | ORAL_TABLET | ORAL | 1 refills | Status: DC
Start: 2017-02-03 — End: 2017-08-04

## 2017-02-11 ENCOUNTER — Encounter: Attending: Internal Medicine

## 2017-02-17 MED ORDER — LISINOPRIL-HYDROCHLOROTHIAZIDE 20 MG-25 MG TAB
20-25 mg | ORAL_TABLET | ORAL | 3 refills | Status: AC
Start: 2017-02-17 — End: ?

## 2017-08-04 ENCOUNTER — Encounter

## 2017-08-04 MED ORDER — ATENOLOL 25 MG TAB
25 mg | ORAL_TABLET | ORAL | 1 refills | Status: AC
Start: 2017-08-04 — End: ?

## 2017-08-11 ENCOUNTER — Encounter

## 2017-08-11 MED ORDER — LISINOPRIL-HYDROCHLOROTHIAZIDE 20 MG-25 MG TAB
20-25 mg | ORAL_TABLET | ORAL | 0 refills | Status: AC
Start: 2017-08-11 — End: ?

## 2017-09-12 NOTE — Telephone Encounter (Signed)
Spoke with patient, moved to [place]. Husband passed and moved back to home state. [facility] last office note and labs.    [facility]. She is requesting last office note and labs to be faxe.

## 2017-09-12 NOTE — Telephone Encounter (Signed)
[facility]     Please fax  last labs and note to  [employer] fax [phone]

## 2018-01-23 ENCOUNTER — Other Ambulatory Visit: Payer: Self-pay | Admitting: Orthopaedic Surgery

## 2018-01-23 ENCOUNTER — Ambulatory Visit
Admission: RE | Admit: 2018-01-23 | Discharge: 2018-01-23 | Disposition: A | Discharge status: Home / Self Care | Payer: Medicare Other | Source: Ambulatory Visit | Attending: Orthopaedic Surgery | Admitting: Orthopaedic Surgery

## 2018-01-23 DIAGNOSIS — T148XXA Other injury of unspecified body region, initial encounter: Secondary | ICD-10-CM

## 2018-01-26 ENCOUNTER — Other Ambulatory Visit: Payer: Self-pay | Admitting: Orthopaedic Surgery

## 2018-01-28 ENCOUNTER — Encounter (HOSPITAL_COMMUNITY): Payer: Self-pay | Admitting: *Deleted

## 2018-01-28 ENCOUNTER — Other Ambulatory Visit: Payer: Self-pay

## 2018-01-28 NOTE — H&P (Signed)
Theresa Gallegos is an 72 y.o. female.   Chief Complaint: Right knee pain HPI: Theresa Gallegos continues with right knee discomfort.  She is doing her best to elevate and keep her weight off this side.  She is in a knee immobilizer and is here with her son and daughter-in-law.   She has been through a CT since last time she was in.  CT: I reviewed a CT of the right [facility] on 01/23/18.  This shows a displaced lateral tibial plateau fracture though the radiologist reads this as medial.  Past Medical History:  Diagnosis Date  . Benign liver cyst   . Hypertension   . Hyperthyroidism     Past Surgical History:  Procedure Laterality Date  . liver cyst surgery      Family History  Problem Relation Age of Onset  . Parkinson's disease Mother   . Dementia Mother   . Diabetes Mother   . Heart attack Father   . Diabetes Father    Social History:  reports that she has never smoked. She has never used smokeless tobacco. She reports that she does not drink alcohol or use drugs.  Allergies: No Known Allergies  No medications prior to admission.    No results found for this or any previous visit (from the past 48 hour(s)). No results found.  Review of Systems  Musculoskeletal: Positive for joint pain.       Right knee  All other systems reviewed and are negative.   There were no vitals taken for this visit. Physical Exam  Constitutional: She is oriented to person, place, and time. She appears well-developed and well-nourished.  HENT:  Head: Normocephalic and atraumatic.  Eyes: Pupils are equal, round, and reactive to light.  Neck: Normal range of motion.  Cardiovascular: Normal rate and regular rhythm.  Respiratory: Effort normal.  GI: Soft.  Musculoskeletal:  Right knee has maybe trace effusion.  She has lateral and very little medial pain.  There is no gross deformity.  Her calf is soft and nontender.  She has good motion of her toes and a palpable pulse.   Neurological: She is alert and oriented to person, place, and time.  Skin: Skin is warm and dry.  Psychiatric: She has a normal mood and affect. Her behavior is normal. Judgment and thought content normal.    Assessment/Plan Assessment: Right knee depressed lateral tibial plateau fracture sustained 01/22/18  Plan: Theresa Gallegos has a significantly depressed plateau fracture.  I think her best management is going to be surgical.  I reviewed risk of anesthesia, infection, DVT related to ORIF of her lateral tibial plateau.  The plan is going to be to expose this and reduce the intra-articular depression and most likely bone graft and place a lateral buttress plate with rafters.  I think this will best be accomplished in the main operating room and we will probably keep her overnight.  We will plan on aspirin for postoperative DVT prophylaxis.  Cire Clute, Ginger OrganNDREW PAUL, PA-C 01/28/2018, 1:43 PM

## 2018-01-28 NOTE — Progress Notes (Signed)
Spoke with pt for pre-op call. Pt denies any cardiac history, chest pain or sob. Pt states her PCP is Dr. Luisa Hartiffany Allen in [place]. Pt states she is not diabetic.

## 2018-01-29 ENCOUNTER — Observation Stay (HOSPITAL_COMMUNITY)
Admission: RE | Admit: 2018-01-29 | Discharge: 2018-01-30 | Disposition: A | Discharge status: Home / Self Care | Payer: Medicare Other | Source: Ambulatory Visit | Attending: Orthopaedic Surgery | Admitting: Orthopaedic Surgery

## 2018-01-29 ENCOUNTER — Ambulatory Visit (HOSPITAL_COMMUNITY): Payer: Medicare Other | Admitting: Certified Registered Nurse Anesthetist

## 2018-01-29 ENCOUNTER — Encounter (HOSPITAL_COMMUNITY): Payer: Self-pay | Admitting: Certified Registered Nurse Anesthetist

## 2018-01-29 ENCOUNTER — Ambulatory Visit (HOSPITAL_COMMUNITY): Payer: Medicare Other

## 2018-01-29 ENCOUNTER — Encounter (HOSPITAL_COMMUNITY)
Admission: RE | Disposition: A | Discharge status: Home / Self Care | Payer: Self-pay | Source: Ambulatory Visit | Attending: Orthopaedic Surgery

## 2018-01-29 DIAGNOSIS — M25561 Pain in right knee: Secondary | ICD-10-CM | POA: Diagnosis present

## 2018-01-29 DIAGNOSIS — I10 Essential (primary) hypertension: Secondary | ICD-10-CM | POA: Diagnosis not present

## 2018-01-29 DIAGNOSIS — S82201A Unspecified fracture of shaft of right tibia, initial encounter for closed fracture: Secondary | ICD-10-CM | POA: Diagnosis present

## 2018-01-29 DIAGNOSIS — X58XXXA Exposure to other specified factors, initial encounter: Secondary | ICD-10-CM | POA: Insufficient documentation

## 2018-01-29 DIAGNOSIS — Z419 Encounter for procedure for purposes other than remedying health state, unspecified: Secondary | ICD-10-CM

## 2018-01-29 DIAGNOSIS — S82121A Displaced fracture of lateral condyle of right tibia, initial encounter for closed fracture: Secondary | ICD-10-CM | POA: Diagnosis not present

## 2018-01-29 DIAGNOSIS — S82209A Unspecified fracture of shaft of unspecified tibia, initial encounter for closed fracture: Secondary | ICD-10-CM | POA: Diagnosis present

## 2018-01-29 HISTORY — DX: Thyrotoxicosis, unspecified without thyrotoxic crisis or storm: E05.90

## 2018-01-29 HISTORY — PX: ORIF TIBIA PLATEAU: SHX2132

## 2018-01-29 HISTORY — DX: Other specified diseases of liver: K76.89

## 2018-01-29 HISTORY — DX: Essential (primary) hypertension: I10

## 2018-01-29 LAB — CBC
HCT: 42.1 % (ref 36.0–46.0)
HEMOGLOBIN: 12.9 g/dL (ref 12.0–15.0)
MCH: 25.7 pg — ABNORMAL LOW (ref 26.0–34.0)
MCHC: 30.6 g/dL (ref 30.0–36.0)
MCV: 83.9 fL (ref 78.0–100.0)
Platelets: 325 10*3/uL (ref 150–400)
RBC: 5.02 MIL/uL (ref 3.87–5.11)
RDW: 15 % (ref 11.5–15.5)
WBC: 9.5 10*3/uL (ref 4.0–10.5)

## 2018-01-29 LAB — BASIC METABOLIC PANEL
ANION GAP: 14 (ref 5–15)
BUN: 24 mg/dL — ABNORMAL HIGH (ref 8–23)
CALCIUM: 9.4 mg/dL (ref 8.9–10.3)
CO2: 22 mmol/L (ref 22–32)
Chloride: 105 mmol/L (ref 98–111)
Creatinine, Ser: 0.65 mg/dL (ref 0.44–1.00)
GFR calc non Af Amer: >60 mL/min (ref >60)
Glucose, Bld: 102 mg/dL — ABNORMAL HIGH (ref 70–99)
Potassium: 4.1 mmol/L (ref 3.5–5.1)
SODIUM: 141 mmol/L (ref 135–145)

## 2018-01-29 SURGERY — OPEN REDUCTION INTERNAL FIXATION (ORIF) TIBIAL PLATEAU
Anesthesia: General | Site: Leg Lower | Laterality: Right

## 2018-01-29 MED ORDER — HYDROCODONE-ACETAMINOPHEN 7.5-325 MG PO TABS
1.0000 | ORAL_TABLET | ORAL | Status: DC | PRN
Start: 2018-01-29 — End: 2018-01-30

## 2018-01-29 MED ORDER — OXYCODONE HCL 5 MG PO TABS: 5.0000 mg | ORAL_TABLET | Freq: Once | ORAL | Status: DC | PRN

## 2018-01-29 MED ORDER — ONDANSETRON HCL 4 MG/2ML IJ SOLN
INTRAMUSCULAR | Status: DC | PRN
  Administered 2018-01-29: 4 mg via INTRAVENOUS

## 2018-01-29 MED ORDER — MORPHINE SULFATE (PF) 2 MG/ML IV SOLN
0.5000 mg | INTRAVENOUS | Status: DC | PRN
Start: 2018-01-29 — End: 2018-01-30

## 2018-01-29 MED ORDER — CEFAZOLIN SODIUM 1 G IJ SOLR
INTRAMUSCULAR | Status: AC
  Filled 2018-01-29: qty 20

## 2018-01-29 MED ORDER — CEFAZOLIN SODIUM-DEXTROSE 2-3 GM-%(50ML) IV SOLR: INTRAVENOUS | Status: DC | PRN

## 2018-01-29 MED ORDER — ROCURONIUM BROMIDE 10 MG/ML (PF) SYRINGE
PREFILLED_SYRINGE | INTRAVENOUS | Status: DC | PRN
  Administered 2018-01-29: 50 mg via INTRAVENOUS

## 2018-01-29 MED ORDER — LACTATED RINGERS IV SOLN
INTRAVENOUS | Status: DC | PRN
  Administered 2018-01-29 (×2): via INTRAVENOUS

## 2018-01-29 MED ORDER — ATENOLOL 50 MG PO TABS
25.0000 mg | ORAL_TABLET | Freq: Every day | ORAL | Status: DC
  Administered 2018-01-30: 25 mg via ORAL
  Filled 2018-01-29: qty 1

## 2018-01-29 MED ORDER — LIDOCAINE HCL (CARDIAC) PF 100 MG/5ML IV SOSY
PREFILLED_SYRINGE | INTRAVENOUS | Status: DC | PRN
  Administered 2018-01-29: 50 mg via INTRAVENOUS

## 2018-01-29 MED ORDER — DIPHENHYDRAMINE HCL 12.5 MG/5ML PO ELIX: 12.5000 mg | ORAL_SOLUTION | ORAL | Status: DC | PRN

## 2018-01-29 MED ORDER — ASPIRIN 325 MG PO TABS
325.0000 mg | ORAL_TABLET | Freq: Two times a day (BID) | ORAL | Status: DC
  Administered 2018-01-30: 325 mg via ORAL
  Filled 2018-01-29: qty 1

## 2018-01-29 MED ORDER — MIDAZOLAM HCL 2 MG/2ML IJ SOLN: 2.0000 mg | Freq: Once | INTRAMUSCULAR | Status: DC

## 2018-01-29 MED ORDER — METOCLOPRAMIDE HCL 5 MG PO TABS: 5.0000 mg | ORAL_TABLET | Freq: Three times a day (TID) | ORAL | Status: DC | PRN

## 2018-01-29 MED ORDER — FENTANYL CITRATE (PF) 100 MCG/2ML IJ SOLN
25.0000 ug | INTRAMUSCULAR | Status: DC | PRN
  Administered 2018-01-29 (×4): 25 ug via INTRAVENOUS

## 2018-01-29 MED ORDER — ONDANSETRON HCL 4 MG/2ML IJ SOLN: 4.0000 mg | Freq: Four times a day (QID) | INTRAMUSCULAR | Status: DC | PRN

## 2018-01-29 MED ORDER — BUPIVACAINE-EPINEPHRINE (PF) 0.25% -1:200000 IJ SOLN
INTRAMUSCULAR | Status: AC
  Filled 2018-01-29: qty 30

## 2018-01-29 MED ORDER — METHOCARBAMOL 500 MG PO TABS: 500.0000 mg | ORAL_TABLET | Freq: Four times a day (QID) | ORAL | Status: DC | PRN

## 2018-01-29 MED ORDER — FENTANYL CITRATE (PF) 100 MCG/2ML IJ SOLN
INTRAMUSCULAR | Status: DC | PRN
  Administered 2018-01-29 (×2): 50 ug via INTRAVENOUS
  Administered 2018-01-29: 100 ug via INTRAVENOUS
  Administered 2018-01-29: 50 ug via INTRAVENOUS

## 2018-01-29 MED ORDER — FENTANYL CITRATE (PF) 100 MCG/2ML IJ SOLN
INTRAMUSCULAR | Status: AC
  Filled 2018-01-29: qty 2

## 2018-01-29 MED ORDER — ONDANSETRON HCL 4 MG PO TABS: 4.0000 mg | ORAL_TABLET | Freq: Four times a day (QID) | ORAL | Status: DC | PRN

## 2018-01-29 MED ORDER — PROPOFOL 10 MG/ML IV BOLUS
INTRAVENOUS | Status: AC
  Filled 2018-01-29: qty 20

## 2018-01-29 MED ORDER — FENTANYL CITRATE (PF) 250 MCG/5ML IJ SOLN
INTRAMUSCULAR | Status: AC
  Filled 2018-01-29: qty 5

## 2018-01-29 MED ORDER — FENTANYL CITRATE (PF) 100 MCG/2ML IJ SOLN: 100.0000 ug | Freq: Once | INTRAMUSCULAR | Status: DC

## 2018-01-29 MED ORDER — SUGAMMADEX SODIUM 200 MG/2ML IV SOLN
INTRAVENOUS | Status: DC | PRN
  Administered 2018-01-29: 200 mg via INTRAVENOUS

## 2018-01-29 MED ORDER — MIDAZOLAM HCL 2 MG/2ML IJ SOLN
INTRAMUSCULAR | Status: AC
  Filled 2018-01-29: qty 2

## 2018-01-29 MED ORDER — HYDROCODONE-ACETAMINOPHEN 5-325 MG PO TABS
1.0000 | ORAL_TABLET | ORAL | Status: DC | PRN
  Administered 2018-01-29 – 2018-01-30 (×2): 1 via ORAL
  Filled 2018-01-29 (×2): qty 1

## 2018-01-29 MED ORDER — 0.9 % SODIUM CHLORIDE (POUR BTL) OPTIME
TOPICAL | Status: DC | PRN
  Administered 2018-01-29: 1000 mL

## 2018-01-29 MED ORDER — METHIMAZOLE 5 MG PO TABS
5.0000 mg | ORAL_TABLET | Freq: Every day | ORAL | Status: DC
  Administered 2018-01-30: 5 mg via ORAL
  Filled 2018-01-29: qty 1

## 2018-01-29 MED ORDER — POVIDONE-IODINE 7.5 % EX SOLN
Freq: Once | CUTANEOUS | Status: DC
  Filled 2018-01-29: qty 118

## 2018-01-29 MED ORDER — SUCCINYLCHOLINE CHLORIDE 200 MG/10ML IV SOSY
PREFILLED_SYRINGE | INTRAVENOUS | Status: AC
  Filled 2018-01-29: qty 10

## 2018-01-29 MED ORDER — ONDANSETRON HCL 4 MG/2ML IJ SOLN: 4.0000 mg | Freq: Once | INTRAMUSCULAR | Status: DC | PRN

## 2018-01-29 MED ORDER — POTASSIUM CHLORIDE 2 MEQ/ML IV SOLN
INTRAVENOUS | Status: DC
  Filled 2018-01-29: qty 1000

## 2018-01-29 MED ORDER — OXYCODONE HCL 5 MG/5ML PO SOLN: 5.0000 mg | Freq: Once | ORAL | Status: DC | PRN

## 2018-01-29 MED ORDER — EPHEDRINE SULFATE-NACL 50-0.9 MG/10ML-% IV SOSY
PREFILLED_SYRINGE | INTRAVENOUS | Status: DC | PRN
  Administered 2018-01-29: 5 mg via INTRAVENOUS
  Administered 2018-01-29: 10 mg via INTRAVENOUS

## 2018-01-29 MED ORDER — KETOROLAC TROMETHAMINE 15 MG/ML IJ SOLN
INTRAMUSCULAR | Status: AC
  Filled 2018-01-29: qty 1

## 2018-01-29 MED ORDER — PROPOFOL 10 MG/ML IV BOLUS
INTRAVENOUS | Status: DC | PRN
  Administered 2018-01-29: 160 mg via INTRAVENOUS

## 2018-01-29 MED ORDER — LISINOPRIL 20 MG PO TABS
20.0000 mg | ORAL_TABLET | Freq: Every day | ORAL | Status: DC
  Administered 2018-01-30: 20 mg via ORAL
  Filled 2018-01-29: qty 1

## 2018-01-29 MED ORDER — CEFAZOLIN SODIUM-DEXTROSE 2-4 GM/100ML-% IV SOLN
INTRAVENOUS | Status: AC
  Filled 2018-01-29: qty 100

## 2018-01-29 MED ORDER — BUPIVACAINE HCL (PF) 0.25 % IJ SOLN
INTRAMUSCULAR | Status: DC | PRN
  Administered 2018-01-29: 15 mL

## 2018-01-29 MED ORDER — BUPIVACAINE LIPOSOME 1.3 % IJ SUSP
20.0000 mL | INTRAMUSCULAR | Status: AC
Start: 2018-01-29 — End: 2018-01-29
  Administered 2018-01-29: 15 mL
  Filled 2018-01-29: qty 20

## 2018-01-29 MED ORDER — DOCUSATE SODIUM 100 MG PO CAPS
100.0000 mg | ORAL_CAPSULE | Freq: Two times a day (BID) | ORAL | Status: DC
  Administered 2018-01-29 – 2018-01-30 (×2): 100 mg via ORAL
  Filled 2018-01-29 (×2): qty 1

## 2018-01-29 MED ORDER — KETOROLAC TROMETHAMINE 15 MG/ML IJ SOLN
7.5000 mg | Freq: Four times a day (QID) | INTRAMUSCULAR | Status: AC
  Administered 2018-01-29 – 2018-01-30 (×4): 7.5 mg via INTRAVENOUS
  Filled 2018-01-29 (×3): qty 1

## 2018-01-29 MED ORDER — ACETAMINOPHEN 325 MG PO TABS: 325.0000 mg | ORAL_TABLET | Freq: Four times a day (QID) | ORAL | Status: DC | PRN

## 2018-01-29 MED ORDER — METHOCARBAMOL 1000 MG/10ML IJ SOLN
500.0000 mg | Freq: Four times a day (QID) | INTRAVENOUS | Status: DC | PRN
  Filled 2018-01-29: qty 5

## 2018-01-29 MED ORDER — ACETAMINOPHEN 500 MG PO TABS
500.0000 mg | ORAL_TABLET | Freq: Four times a day (QID) | ORAL | Status: DC
  Administered 2018-01-29 – 2018-01-30 (×3): 500 mg via ORAL
  Filled 2018-01-29 (×3): qty 1

## 2018-01-29 MED ORDER — ONDANSETRON HCL 4 MG/2ML IJ SOLN
INTRAMUSCULAR | Status: AC
  Filled 2018-01-29: qty 2

## 2018-01-29 MED ORDER — LISINOPRIL-HYDROCHLOROTHIAZIDE 20-25 MG PO TABS: 1.0000 | ORAL_TABLET | Freq: Every day | ORAL | Status: DC

## 2018-01-29 MED ORDER — LACTATED RINGERS IV SOLN
INTRAVENOUS | Status: DC
  Administered 2018-01-29: 17:00:00 via INTRAVENOUS

## 2018-01-29 MED ORDER — HYDROCHLOROTHIAZIDE 25 MG PO TABS
25.0000 mg | ORAL_TABLET | Freq: Every day | ORAL | Status: DC
  Administered 2018-01-30: 25 mg via ORAL
  Filled 2018-01-29: qty 1

## 2018-01-29 MED ORDER — METOCLOPRAMIDE HCL 5 MG/ML IJ SOLN: 5.0000 mg | Freq: Three times a day (TID) | INTRAMUSCULAR | Status: DC | PRN

## 2018-01-29 MED ORDER — CEFAZOLIN SODIUM-DEXTROSE 2-4 GM/100ML-% IV SOLN
2.0000 g | INTRAVENOUS | Status: AC
  Administered 2018-01-29: 2 g via INTRAVENOUS

## 2018-01-29 MED ORDER — EPHEDRINE 5 MG/ML INJ
INTRAVENOUS | Status: AC
  Filled 2018-01-29: qty 10

## 2018-01-29 SURGICAL SUPPLY — 77 items
BANDAGE ACE 4X5 VEL STRL LF (GAUZE/BANDAGES/DRESSINGS) ×3 IMPLANT
BANDAGE ACE 6X5 VEL STRL LF (GAUZE/BANDAGES/DRESSINGS) ×3 IMPLANT
BIT DRILL 2.5MM SMALL QC EVOS (BIT) ×1 IMPLANT
BIT DRILL OVR 3.5AO QC SHRT SM (DRILL) ×1 IMPLANT
BIT DRILL QC 2.5MM SHRT EVO SM (DRILL) ×1 IMPLANT
BLADE CLIPPER SURG (BLADE) IMPLANT
BLADE SURG 10 STRL SS (BLADE) ×3 IMPLANT
BLADE SURG 15 STRL LF DISP TIS (BLADE) ×1 IMPLANT
BLADE SURG 15 STRL SS (BLADE) ×2
BNDG GAUZE ELAST 4 BULKY (GAUZE/BANDAGES/DRESSINGS) ×3 IMPLANT
BRUSH SCRUB SURG 4.25 DISP (MISCELLANEOUS) ×6 IMPLANT
CLEANER TIP ELECTROSURG 2X2 (MISCELLANEOUS) ×3 IMPLANT
COVER MAYO STAND STRL (DRAPES) ×3 IMPLANT
DRAPE C-ARM 42X72 X-RAY (DRAPES) ×3 IMPLANT
DRAPE INCISE IOBAN 66X45 STRL (DRAPES) ×3 IMPLANT
DRAPE ORTHO SPLIT 77X108 STRL (DRAPES)
DRAPE SURG ORHT 6 SPLT 77X108 (DRAPES) IMPLANT
DRAPE U-SHAPE 47X51 STRL (DRAPES) ×3 IMPLANT
DRILL 2.5MM SMALL QC EVOS (BIT) ×3
DRILL OVER 3.5 AO QC SHORT SM (DRILL) ×3
DRILL QC 2.5MM SHORT EVOS SM (DRILL) ×3
DRSG ADAPTIC 3X8 NADH LF (GAUZE/BANDAGES/DRESSINGS) ×3 IMPLANT
DRSG PAD ABDOMINAL 8X10 ST (GAUZE/BANDAGES/DRESSINGS) ×12 IMPLANT
ELECT REM PT RETURN 9FT ADLT (ELECTROSURGICAL) ×3
ELECTRODE REM PT RTRN 9FT ADLT (ELECTROSURGICAL) ×1 IMPLANT
EVACUATOR 1/8 PVC DRAIN (DRAIN) IMPLANT
EVACUATOR 3/16  PVC DRAIN (DRAIN)
EVACUATOR 3/16 PVC DRAIN (DRAIN) IMPLANT
GAUZE SPONGE 4X4 12PLY STRL (GAUZE/BANDAGES/DRESSINGS) ×3 IMPLANT
GLOVE BIO SURGEON STRL SZ8 (GLOVE) ×12 IMPLANT
GLOVE BIOGEL PI IND STRL 8 (GLOVE) ×1 IMPLANT
GLOVE BIOGEL PI INDICATOR 8 (GLOVE) ×2
GOWN STRL REUS W/ TWL LRG LVL3 (GOWN DISPOSABLE) ×4 IMPLANT
GOWN STRL REUS W/TWL 2XL LVL3 (GOWN DISPOSABLE) ×3 IMPLANT
GOWN STRL REUS W/TWL LRG LVL3 (GOWN DISPOSABLE) ×8
K-WIRE 1.6 (WIRE) ×2
K-WIRE FX150X1.6XTROC PNT (WIRE) ×1
KIT BASIN OR (CUSTOM PROCEDURE TRAY) ×3 IMPLANT
KIT TURNOVER KIT B (KITS) ×3 IMPLANT
KWIRE FX150X1.6XTROC PNT (WIRE) ×1 IMPLANT
MANIFOLD NEPTUNE II (INSTRUMENTS) ×3 IMPLANT
NEEDLE 22X1 1/2 (OR ONLY) (NEEDLE) IMPLANT
NS IRRIG 1000ML POUR BTL (IV SOLUTION) ×3 IMPLANT
PACK ORTHO EXTREMITY (CUSTOM PROCEDURE TRAY) ×3 IMPLANT
PAD ABD 8X10 STRL (GAUZE/BANDAGES/DRESSINGS) ×3 IMPLANT
PAD ARMBOARD 7.5X6 YLW CONV (MISCELLANEOUS) ×6 IMPLANT
PAD CAST 4YDX4 CTTN HI CHSV (CAST SUPPLIES) ×1 IMPLANT
PADDING CAST COTTON 4X4 STRL (CAST SUPPLIES) ×2
PADDING CAST COTTON 6X4 STRL (CAST SUPPLIES) ×3 IMPLANT
PLATE LOCK EVOS 3.5X91 6H RT (Plate) ×3 IMPLANT
SCREW CTX 3.5X36MM EVOS (Screw) ×3 IMPLANT
SCREW CTX 3.5X38MM EVOS (Screw) ×3 IMPLANT
SCREW CTX ST EVOS 3.5X40 (Screw) ×3 IMPLANT
SCREW LOCKING 3.5X75 (Screw) ×6 IMPLANT
SCREW OST PT EVOS 4.7X75 (Screw) ×3 IMPLANT
SPONGE LAP 18X18 X RAY DECT (DISPOSABLE) ×3 IMPLANT
STAPLER VISISTAT 35W (STAPLE) ×3 IMPLANT
STOCKINETTE IMPERVIOUS LG (DRAPES) ×3 IMPLANT
SUCTION FRAZIER HANDLE 10FR (MISCELLANEOUS) ×2
SUCTION TUBE FRAZIER 10FR DISP (MISCELLANEOUS) ×1 IMPLANT
SUT ETHIBOND 2 0 V5 (SUTURE) ×3 IMPLANT
SUT PDS AB 2-0 CT1 27 (SUTURE) IMPLANT
SUT PROLENE 0 CT 2 (SUTURE) IMPLANT
SUT VIC AB 0 CT1 27 (SUTURE) ×4
SUT VIC AB 0 CT1 27XBRD ANBCTR (SUTURE) ×2 IMPLANT
SUT VIC AB 1 CT1 27 (SUTURE) ×4
SUT VIC AB 1 CT1 27XBRD ANBCTR (SUTURE) ×2 IMPLANT
SUT VIC AB 2-0 CT1 27 (SUTURE) ×4
SUT VIC AB 2-0 CT1 TAPERPNT 27 (SUTURE) ×2 IMPLANT
SYR 20ML ECCENTRIC (SYRINGE) IMPLANT
TOWEL OR 17X24 6PK STRL BLUE (TOWEL DISPOSABLE) ×3 IMPLANT
TOWEL OR 17X26 10 PK STRL BLUE (TOWEL DISPOSABLE) ×6 IMPLANT
TRAY FOLEY MTR SLVR 16FR STAT (SET/KITS/TRAYS/PACK) IMPLANT
TUBE CONNECTING 12'X1/4 (SUCTIONS) ×1
TUBE CONNECTING 12X1/4 (SUCTIONS) ×2 IMPLANT
WATER STERILE IRR 1000ML POUR (IV SOLUTION) ×6 IMPLANT
YANKAUER SUCT BULB TIP NO VENT (SUCTIONS) ×3 IMPLANT

## 2018-01-29 NOTE — Interval H&P Note (Signed)
History and Physical Interval Note:  01/29/2018 11:43 AM  Theresa Gallegos  has presented today for surgery, with the diagnosis of RIGHT KNEE TIBIAL PLATEAU FRACTURE  The various methods of treatment have been discussed with the patient and family. After consideration of risks, benefits and other options for treatment, the patient has consented to  Procedure(s): OPEN REDUCTION INTERNAL FIXATION (ORIF) TIBIAL PLATEAU (Right) as a surgical intervention .  The patient's history has been reviewed, patient examined, no change in status, stable for surgery.  I have reviewed the patient's chart and labs.  Questions were answered to the patient's satisfaction.     Zakary Kimura G

## 2018-01-29 NOTE — Op Note (Signed)
Theresa MaserFlora F Gallegos [id number] 01/29/2018   PRE-OP DIAGNOSIS: right knee tibial plateau fracture  POST-OP DIAGNOSIS: same  PROCEDURE: ORIF lateral tibial plateau  ANESTHESIA: general and block  Elex Mainwaring G   Dictation #:  [id number]

## 2018-01-29 NOTE — Transfer of Care (Signed)
Immediate Anesthesia Transfer of Care Note  Patient: Theresa Gallegos  Procedure(s) Performed: OPEN REDUCTION INTERNAL FIXATION (ORIF) TIBIAL PLATEAU (Right Leg Lower)  Patient Location: PACU  Anesthesia Type:GA combined with regional for post-op pain  Level of Consciousness: awake, alert  and oriented  Airway & Oxygen Therapy: Patient Spontanous Breathing  Post-op Assessment: Report given to RN and Post -op Vital signs reviewed and stable  Post vital signs: Reviewed and stable  Last Vitals:  Vitals Value Taken Time  BP    Temp    Pulse    Resp    SpO2      Last Pain:  Vitals:   01/29/18 1030  TempSrc:   PainSc: 0-No pain      Patients Stated Pain Goal: 0 (01/29/18 1030)  Complications: No apparent anesthesia complications

## 2018-01-29 NOTE — Op Note (Signed)
NAME: Theresa Gallegos, Neleh F. MEDICAL RECORD [id number] ACCOUNT [id number] DATE OF BIRTH:[birth year] FACILITY: [facility] LOCATION: [facility] PHYSICIAN:Mckyle Solanki Janann ColonelG. Keishia Ground, MD  OPERATIVE REPORT  DATE OF PROCEDURE:  01/29/2018  PREOPERATIVE DIAGNOSIS:  Right tibial plateau fracture.  POSTOPERATIVE DIAGNOSIS:  Right tibial plateau fracture.  PROCEDURE PERFORMED:  Right tibial plateau open reduction internal fixation.  ANESTHESIA:  General and block.  ATTENDING SURGEON:  Marcene CorningPeter Cesilia Shinn, MD  ASSISTANT:  Elodia FlorenceAndrew Nida, PA  INDICATION FOR PROCEDURE:  The patient is a 72 year old woman about a week out from a dog versus leg injury.  She suffered a depressed lateral tibial plateau fracture.  She was seen in the emergency room and given a knee immobilizer.  We set her up with  a CT scan a bit later, and she was offered ORIF in hopes of reestablishing the congruity of her joint.  Informed operative consent was obtained after discussion of possible complications including reaction to anesthesia, infection, and the probability of  further degenerative change at the knee.    SUMMARY, FINDINGS AND PROCEDURE:  Under general anesthesia and a block through an anterolateral approach, the tibial plateau fracture was exposed.  We reduced the depressed fragments and then stabilized with a Smith and Nephew 6-hole EVOS proximal tibia  plate.  We used the 2.5 Hex head screwdriver for all screws which were a combination of locking and unlocking unlocked screws.  I used fluoroscopy throughout the case to make appropriate intraoperative decisions and read all of these views myself.   Elodia FlorenceAndrew Nida, PA, assisted throughout and was invaluable to the completion of the case, mostly in that he maintained reduction while I placed hardware.  He also closed simultaneously to help minimize OR time.  DESCRIPTION OF PROCEDURE:  The patient was taken to the operating suite where general anesthetic was applied without difficulty.  She was  also given a block in the preanesthesia area.  She was positioned supine with a bump under the right hip.  She was  prepped and draped in normal sterile fashion.  After the administration of preoperative IV Kefzol and appropriate timeout, the right leg was elevated, exsanguinated, and tourniquet inflated about the thigh.  We placed her knee over a triangle and made an  anterolateral approach with dissection down to fascia.  I then incised the IT band distally and then the fascia of the anterior compartment near the tibia with care taken to maintain a cuff of tissue for closure.  I then elevated the musculature in the  anterior compartment off the proximal tibia.  I made a drill hole in the tibia and then placed a Freer into the bone.  Under fluoroscopic guidance, I managed to reduce the depressed fragment anatomically.  This was fairly simple.  We then placed the  6-hole right Smith and Nephew EVOS proximal tibial plate in the appropriate position.  I used one of the most proximal raft screws at the plate and put this across the joint in appropriate position.  We used a partially threaded cancellous screw to  compress the fracture site adequately.  This gave us a nice reduction.  I did not feel that a bone graft was necessary.  We subsequently placed 2 more raft screws, both of which were locking.  I then placed 3 screws distally, all of which were cortical  screws.  Fluoroscopy was used to confirm adequate reduction of the fracture and placement of hardware in 2 planes.  I then irrigated the knee thoroughly, followed by reapproximation of fascial layer  with 0 Vicryl in interrupted fashion.  The tourniquet  was deflated, and a small amount of bleeding was easily controlled with pressure and Bovie cautery.  We then reapproximated the subcutaneous tissues in 2 layers with Vicryl followed by subcuticular stitch and some loose Steri-Strips.  Adaptic was applied  followed by dry gauze and a loose Ace wrap and  knee immobilizer.  We did inject some Exparel towards the end of the case.  ESTIMATED BLOOD LOSS AND FLUIDS:  This can be obtained from the anesthesia records, as can accurate tourniquet time.  DISPOSITION:  The patient was extubated in the operating room and taken to recovery room in stable condition.  She was to be admitted for overnight observation with probable discharge home in the morning.  LN/NUANCE  D:01/29/2018 T:01/29/2018 JOB:[id number]

## 2018-01-29 NOTE — Anesthesia Preprocedure Evaluation (Addendum)
Anesthesia Evaluation  Patient identified by MRN, date of birth, ID band Patient awake    Reviewed: Allergy & Precautions, NPO status , Patient's Chart, lab work & pertinent test results, reviewed documented beta blocker date and time   Airway Mallampati: II  TM Distance: >3 FB Neck ROM: Full    Dental  (+) Edentulous Upper, Edentulous Lower   Pulmonary    breath sounds clear to auscultation       Cardiovascular hypertension, Pt. on medications and Pt. on home beta blockers  Rhythm:Regular Rate:Normal     Neuro/Psych    GI/Hepatic   Endo/Other    Renal/GU      Musculoskeletal   Abdominal   Peds  Hematology   Anesthesia Other Findings   Reproductive/Obstetrics                            Anesthesia Physical Anesthesia Plan  ASA: III  Anesthesia Plan: General   Post-op Pain Management:  Regional for Post-op pain   Induction: Intravenous  PONV Risk Score and Plan: Ondansetron  Airway Management Planned: Oral ETT  Additional Equipment:   Intra-op Plan:   Post-operative Plan: Extubation in OR  Informed Consent: I have reviewed the patients History and Physical, chart, labs and discussed the procedure including the risks, benefits and alternatives for the proposed anesthesia with the patient or authorized representative who has indicated his/her understanding and acceptance.   Dental advisory given  Plan Discussed with: CRNA, Anesthesiologist and Surgeon  Anesthesia Plan Comments:        Anesthesia Quick Evaluation

## 2018-01-29 NOTE — Anesthesia Procedure Notes (Signed)
Procedure Name: Intubation Date/Time: 01/29/2018 1:24 PM Performed by: Carney Living, CRNA Pre-anesthesia Checklist: Patient identified, Emergency Drugs available, Suction available, Patient being monitored and Timeout performed Patient Re-evaluated:Patient Re-evaluated prior to induction Oxygen Delivery Method: Circle system utilized Preoxygenation: Pre-oxygenation with 100% oxygen Induction Type: IV induction Ventilation: Mask ventilation without difficulty and Oral airway inserted - appropriate to patient size Laryngoscope Size: Mac and 4 Grade View: Grade I Tube type: Oral Tube size: 7.0 mm Number of attempts: 1 Airway Equipment and Method: Stylet Placement Confirmation: ETT inserted through vocal cords under direct vision,  positive ETCO2 and breath sounds checked- equal and bilateral Secured at: 20 cm Tube secured with: Tape Dental Injury: Teeth and Oropharynx as per pre-operative assessment

## 2018-01-29 NOTE — Plan of Care (Signed)

## 2018-01-29 NOTE — Anesthesia Procedure Notes (Signed)
Anesthesia Regional Block: Adductor canal block   Pre-Anesthetic Checklist: ,, timeout performed, Correct Patient, Correct Site, Correct Laterality, Correct Procedure, Correct Position, site marked, Risks and benefits discussed, pre-op evaluation,  At surgeon's request and post-op pain management  Laterality: Right  Prep: Maximum Sterile Barrier Precautions used, chloraprep       Needles:  Injection technique: Single-shot  Needle Type: Echogenic Stimulator Needle     Needle Length: 9cm  Needle Gauge: 21     Additional Needles:   Procedures:,,,, ultrasound used (permanent image in chart),,,,  Narrative:  Start time: 01/29/2018 12:25 PM End time: 01/29/2018 12:30 PM Injection made incrementally with aspirations every 5 mL.  Performed by: Personally  Anesthesiologist: Kipp BroodJoslin, Algenis Ballin, MD  Additional Notes: 20 cc 0.75% Ropivacaine injected easily

## 2018-01-30 ENCOUNTER — Other Ambulatory Visit: Payer: Self-pay

## 2018-01-30 DIAGNOSIS — S82121A Displaced fracture of lateral condyle of right tibia, initial encounter for closed fracture: Secondary | ICD-10-CM | POA: Diagnosis not present

## 2018-01-30 MED ORDER — TIZANIDINE HCL 2 MG PO TABS: 2.0000 mg | ORAL_TABLET | Freq: Four times a day (QID) | ORAL | 1 refills | Status: AC | PRN

## 2018-01-30 MED ORDER — HYDROCODONE-ACETAMINOPHEN 5-325 MG PO TABS: 1.0000 | ORAL_TABLET | Freq: Four times a day (QID) | ORAL | 0 refills | Status: AC | PRN

## 2018-01-30 MED ORDER — ASPIRIN 325 MG PO TABS: 325.0000 mg | ORAL_TABLET | Freq: Two times a day (BID) | ORAL | 0 refills | Status: AC

## 2018-01-30 NOTE — Anesthesia Postprocedure Evaluation (Signed)
Anesthesia Post Note  Patient: Kathaleen MaserFlora F Grigoryan  Procedure(s) Performed: OPEN REDUCTION INTERNAL FIXATION (ORIF) TIBIAL PLATEAU (Right Leg Lower)     Patient location during evaluation: PACU Anesthesia Type: General and Regional Level of consciousness: awake and alert Pain management: pain level controlled Vital Signs Assessment: post-procedure vital signs reviewed and stable Respiratory status: spontaneous breathing, nonlabored ventilation, respiratory function stable and patient connected to nasal cannula oxygen Cardiovascular status: blood pressure returned to baseline and stable Postop Assessment: no apparent nausea or vomiting Anesthetic complications: no    Last Vitals:  Vitals:   01/30/18 0043 01/30/18 0446  BP: (!) 119/56 138/64  Pulse: 78 81  Resp: 16 16  Temp: 37.2 C 37.1 C  SpO2: 92% 96%    Last Pain:  Vitals:   01/30/18 0601  TempSrc:   PainSc: Asleep                 Eesa Justiss COKER

## 2018-01-30 NOTE — Progress Notes (Signed)
Subjective: 1 Day Post-Op Procedure(s) (LRB): OPEN REDUCTION INTERNAL FIXATION (ORIF) TIBIAL PLATEAU (Right)   Patient is doing great. She has minimal pain. She is looking forward to going home.  Activity level:  Nonweightbearing right leg Diet tolerance:  ok Voiding:  ok Patient reports pain as mild.    Objective: Vital signs in last 24 hours: Temp:  [97.7 F (36.5 C)-98.9 F (37.2 C)] 98.7 F (37.1 C) (08/02 0446) Pulse Rate:  [65-98] 81 (08/02 0446) Resp:  [11-18] 16 (08/02 0446) BP: (111-176)/(56-88) 138/64 (08/02 0446) SpO2:  [91 %-100 %] 96 % (08/02 0446) Weight:  [94.3 kg (208 lb)] 94.3 kg (208 lb) (08/01 1014)  Labs: Recent Labs    01/29/18 1029  HGB 12.9   Recent Labs    01/29/18 1029  WBC 9.5  RBC 5.02  HCT 42.1  PLT 325   Recent Labs    01/29/18 1029  NA 141  K 4.1  CL 105  CO2 22  BUN 24*  CREATININE 0.65  GLUCOSE 102*  CALCIUM 9.4   No results for input(s): LABPT, INR in the last 72 hours.  Physical Exam:  Neurologically intact ABD soft Neurovascular intact Sensation intact distally Intact pulses distally Dorsiflexion/Plantar flexion intact Incision: dressing C/D/I and no drainage No cellulitis present Compartment soft  Assessment/Plan:  1 Day Post-Op Procedure(s) (LRB): OPEN REDUCTION INTERNAL FIXATION (ORIF) TIBIAL PLATEAU (Right) Advance diet Up with therapy Discharge home today after PT this morning.  Remain nonweightbearing Right leg. Continue on ASA 325mg  BID for DVT prevention. Follow up in office 10 days post op.  Ashkan Chamberland, Ginger OrganNDREW PAUL 01/30/2018, 8:04 AM

## 2018-01-30 NOTE — Progress Notes (Signed)
Patient discharging home today. Discharge instructions explained to patient and she verbalized understanding. Took all personal belongings. No further questions or concerns voiced.  

## 2018-01-30 NOTE — Anesthesia Postprocedure Evaluation (Signed)
Anesthesia Post Note  Patient: Theresa Gallegos  Procedure(s) Performed: OPEN REDUCTION INTERNAL FIXATION (ORIF) TIBIAL PLATEAU (Right Leg Lower)     Anesthesia Post Evaluation  Last Vitals:  Vitals:   01/30/18 0043 01/30/18 0446  BP: (!) 119/56 138/64  Pulse: 78 81  Resp: 16 16  Temp: 37.2 C 37.1 C  SpO2: 92% 96%    Last Pain:  Vitals:   01/30/18 0601  TempSrc:   PainSc: Asleep                 Dereck Agerton COKER

## 2018-01-30 NOTE — Plan of Care (Signed)

## 2018-01-30 NOTE — Discharge Summary (Signed)
Patient ID: Theresa Gallegos MRN: [id number] DOB/AGE: [birth year] 71 y.o.  Admit date: 01/29/2018 Discharge date: 01/30/2018  Admission Diagnoses:  Principal Problem:   Right tibial fracture Active Problems:   Closed tibia fracture   Discharge Diagnoses:  Same  Past Medical History:  Diagnosis Date  . Benign liver cyst   . Hypertension   . Hyperthyroidism     Surgeries: Procedure(s): OPEN REDUCTION INTERNAL FIXATION (ORIF) TIBIAL PLATEAU on 01/29/2018   Consultants:   Discharged Condition: Improved  Hospital Course: Theresa Gallegos is an 72 y.o. female who was admitted 01/29/2018 for operative treatment ofRight tibial fracture. Patient has severe unremitting pain that affects sleep, daily activities, and work/hobbies. After pre-op clearance the patient was taken to the operating room on 01/29/2018 and underwent  Procedure(s): OPEN REDUCTION INTERNAL FIXATION (ORIF) TIBIAL PLATEAU.    Patient was given perioperative antibiotics:  Anti-infectives (From admission, onward)   Start     Dose/Rate Route Frequency Ordered Stop   01/29/18 1215  ceFAZolin (ANCEF) IVPB 2g/100 mL premix     2 g 200 mL/hr over 30 Minutes Intravenous On call to O.R. 01/29/18 1010 01/29/18 1340   01/29/18 1016  ceFAZolin (ANCEF) 2-4 GM/100ML-% IVPB    Note to Pharmacy:  Posey Pronto   : cabinet override      01/29/18 1016 01/29/18 1325       Patient was given sequential compression devices, early ambulation, and chemoprophylaxis to prevent DVT.  Patient benefited maximally from hospital stay and there were no complications.    Recent vital signs:  Patient Vitals for the past 24 hrs:  BP Temp Temp src Pulse Resp SpO2 Height Weight  01/30/18 0446 138/64 98.7 F (37.1 C) Oral 81 16 96 % - -  01/30/18 0043 (!) 119/56 98.9 F (37.2 C) Oral 78 16 92 % - -  01/29/18 1948 (!) 111/56 97.7 F (36.5 C) Oral 98 16 91 % - -  01/29/18 1652 (!) 146/65 98.2 F (36.8 C) Oral 81 - 95 % - -  01/29/18 1615 (!) 163/79  97.9 F (36.6 C) - 71 14 96 % - -  01/29/18 1600 (!) 170/69 - - 70 13 100 % - -  01/29/18 1545 (!) 173/76 - - 68 14 93 % - -  01/29/18 1530 (!) 174/64 - - 73 11 95 % - -  01/29/18 1515 (!) 172/88 - - - - - - -  01/29/18 1500 (!) 176/83 98.4 F (36.9 C) - 80 16 97 % - -  01/29/18 1014 128/60 98 F (36.7 C) Oral 65 18 99 % 5\' 4"  (1.626 m) 94.3 kg (208 lb)     Recent laboratory studies:  Recent Labs    01/29/18 1029  WBC 9.5  HGB 12.9  HCT 42.1  PLT 325  NA 141  K 4.1  CL 105  CO2 22  BUN 24*  CREATININE 0.65  GLUCOSE 102*  CALCIUM 9.4     Discharge Medications:   Allergies as of 01/30/2018   No Known Allergies     Medication List    TAKE these medications   aspirin 325 MG tablet Take 1 tablet (325 mg total) by mouth 2 (two) times daily.   atenolol 25 MG tablet Commonly known as:  TENORMIN Take 25 mg by mouth daily.   HYDROcodone-acetaminophen 5-325 MG tablet Commonly known as:  NORCO/VICODIN Take 1-2 tablets by mouth every 6 (six) hours as needed for moderate pain (pain score 4-6).   ICAPS  AREDS FORMULA PO Take 1 tablet by mouth 2 (two) times daily.   lisinopril-hydrochlorothiazide 20-25 MG tablet Commonly known as:  PRINZIDE,ZESTORETIC Take 1 tablet by mouth daily.   methimazole 5 MG tablet Commonly known as:  TAPAZOLE Take 5 mg by mouth daily.   tiZANidine 2 MG tablet Commonly known as:  ZANAFLEX Take 1-2 tablets (2-4 mg total) by mouth every 6 (six) hours as needed.       Diagnostic Studies: Dg Knee 1-2 Views Right  Result Date: 01/29/2018 CLINICAL DATA:  Surgery: Right tibial plateau ORIF Fluoro time: 45 seconds RSTO: JLC EXAM: RIGHT KNEE - 1-2 VIEW; DG C-ARM 61-120 MIN COMPARISON:  CT 01/23/2018 FINDINGS: Status post ORIF of the proximal tibia with LATERAL screw plate. Interval reduction of tibial plateau fracture. Mild degenerative changes are present. IMPRESSION: Status post ORIF of the RIGHT tibia. Electronically Signed   By: Norva Pavlov  M.D.   On: 01/29/2018 14:34   Ct Knee Right Wo Contrast  Result Date: 01/23/2018 CLINICAL DATA:  The patient suffered a right tibial plateau fracture in a fall yesterday. EXAM: CT OF THE RIGHT KNEE WITHOUT CONTRAST TECHNIQUE: Multidetector CT imaging of the right knee was performed according to the standard protocol. Multiplanar CT image reconstructions were also generated. COMPARISON:  None. FINDINGS: Bones/Joint/Cartilage The patient has an acute fracture, mildly comminuted of the medial tibial plateau. Area of involvement at the articular surface measures 1.9 cm AP by 1.5 cm transverse. There is inferior angulation of the articular surface of up to 17 degrees anteriorly and depression of up to 0.6 cm. The fracture extends inferiorly through the medial metaphysis approximately 3.5 cm below the articular surface. 0.5 cm of the periphery of the articular surface of the medial plateau is not depressed but is peripherally displaced 0.4 cm. No other fracture is identified. There is no focal bony lesion. There is osteophytosis about all 3 compartments of the knee consistent with moderate degenerative change. Ligaments Suboptimally assessed by CT. The cruciate and collateral ligaments appear intact. The menisci have a normal CT appearance. Muscles and Tendons Intact and normal in appearance. Soft tissues Moderate joint effusion is seen and contains some hemorrhage. IMPRESSION: Mildly comminuted medial tibial plateau fracture as described above with up to 0.6 cm of depression. No other fracture is identified. 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 Electronically Signed   By: Drusilla Kanner M.D.   On: 01/23/2018 13:07   Dg C-arm 1-60 Min  Result Date: 01/29/2018 CLINICAL DATA:  Surgery: Right tibial plateau ORIF Fluoro time: 45 seconds RSTO: JLC EXAM: RIGHT KNEE - 1-2 VIEW; DG C-ARM 61-120 MIN COMPARISON:  CT 01/23/2018 FINDINGS: Status post ORIF of the proximal tibia with LATERAL  screw plate. Interval reduction of tibial plateau fracture. Mild degenerative changes are present. IMPRESSION: Status post ORIF of the RIGHT tibia. Electronically Signed   By: Norva Pavlov M.D.   On: 01/29/2018 14:34    Disposition: Discharge disposition: 01-Home or Self Care       Discharge Instructions    Call MD / Call 911   Complete by:  As directed    If you experience chest pain or shortness of breath, CALL 911 and be transported to the hospital emergency room.  If you develope a fever above 101 F, pus (white drainage) or increased drainage or redness at the wound, or calf pain, call your surgeon's office.   Constipation Prevention   Complete by:  As directed  Drink plenty of fluids.  Prune juice may be helpful.  You may use a stool softener, such as Colace (over the counter) 100 mg twice a day.  Use MiraLax (over the counter) for constipation as needed.   Diet - low sodium heart healthy   Complete by:  As directed    Discharge instructions   Complete by:  As directed    Ice, Elevate, Nonweightbearing right leg. Keep bandage clean and dry until follow up. Follow up in office 10 days post op.   Increase activity slowly as tolerated   Complete by:  As directed       Follow-up Information    Marcene Corningalldorf, Peter, MD. Schedule an appointment as soon as possible for a visit in 2 weeks.   Specialty:  Orthopedic Surgery Contact information: [address] [place] [id number] [phone]            Signed: Drema HalonIDA, Albie Arizpe PAUL 01/30/2018, 8:10 AM

## 2018-01-30 NOTE — Evaluation (Signed)
Physical Therapy Evaluation Patient Details Name: Theresa MaserFlora F Gallegos MRN: [id number] DOB: 1946-02-15 Today's Date: 01/30/2018   History of Present Illness  Pt is a 72 y/o female s/p R tibial ORIF. PMH including but not limited to HTN and hyperthyroidism.  Clinical Impression  Pt presented supine in bed with HOB elevated, awake and willing to participate in therapy session. Prior to admission, pt reported that she was modified independent with functional mobility, using a w/c primarily for mobility. Pt currently able to perform bed mobility with min guard, transfers with min guard and ambulate within her room with RW and min guard for safety. Plan is for pt to d/c to her children's single level home with a level entry today. Pt is ready to d/c home from a PT perspective. PT will continue to follow acutely.     Follow Up Recommendations No PT follow up;Supervision - Intermittent;Other (comment)(OP PT once cleared by MD)    Equipment Recommendations  None recommended by PT    Recommendations for Other Services       Precautions / Restrictions Precautions Precautions: Fall Restrictions Weight Bearing Restrictions: Yes RLE Weight Bearing: Non weight bearing      Mobility  Bed Mobility Overal bed mobility: Needs Assistance Bed Mobility: Supine to Sit     Supine to sit: Min guard     General bed mobility comments: increased time and effort, min guard for safety  Transfers Overall transfer level: Needs assistance Equipment used: Rolling walker (2 wheeled) Transfers: Sit to/from Stand Sit to Stand: Min guard         General transfer comment: pt with good technique, min guard for safety  Ambulation/Gait Ambulation/Gait assistance: Min guard Gait Distance (Feet): 20 Feet(20' x2 with sitting rest break on toilet) Assistive device: Rolling walker (2 wheeled) Gait Pattern/deviations: (hop-to on L LE) Gait velocity: decreased Gait velocity interpretation: <1.31 ft/sec, indicative  of household ambulator General Gait Details: pt steady with RW, able to maintain NWB R LE independently throughout  Stairs            Wheelchair Mobility    Modified Rankin (Stroke Patients Only)       Balance Overall balance assessment: Needs assistance Sitting-balance support: No upper extremity supported Sitting balance-Leahy Scale: Good     Standing balance support: Bilateral upper extremity supported Standing balance-Leahy Scale: Poor                               Pertinent Vitals/Pain Pain Assessment: Faces Faces Pain Scale: Hurts a little bit Pain Location: R LE/ankle Pain Descriptors / Indicators: Sore Pain Intervention(s): Monitored during session;Repositioned    Home Living Family/patient expects to be discharged to:: Private residence Living Arrangements: Alone Available Help at Discharge: Family;Available PRN/intermittently Type of Home: House Home Access: Level entry     Home Layout: One level Home Equipment: Emergency planning/management officerhower seat;Walker - 2 wheels;Wheelchair - manual      Prior Function Level of Independence: Independent with assistive device(s)         Comments: was using w/c primarily for mobility but was ambulating short distances with her RW     Hand Dominance        Extremity/Trunk Assessment   Upper Extremity Assessment Upper Extremity Assessment: Overall WFL for tasks assessed    Lower Extremity Assessment Lower Extremity Assessment: Overall WFL for tasks assessed;RLE deficits/detail RLE Deficits / Details: pt able to maintain NWB R LE throughout independently; pt with  sensation grossly intact to light touch throughout. pt stated that her MD informed her to maintain knee in extended position, however, no ROM restriction in orders or in MD notes       Communication   Communication: No difficulties  Cognition Arousal/Alertness: Awake/alert Behavior During Therapy: WFL for tasks assessed/performed Overall Cognitive  Status: Within Functional Limits for tasks assessed                                        General Comments      Exercises     Assessment/Plan    PT Assessment Patient needs continued PT services  PT Problem List Decreased balance;Decreased mobility;Decreased coordination;Pain       PT Treatment Interventions DME instruction;Stair training;Functional mobility training;Gait training;Therapeutic activities;Therapeutic exercise;Balance training;Neuromuscular re-education;Patient/family education    PT Goals (Current goals can be found in the Care Plan section)  Acute Rehab PT Goals Patient Stated Goal: return to independence PT Goal Formulation: With patient Time For Goal Achievement: 02/13/18 Potential to Achieve Goals: Good    Frequency Min 5X/week   Barriers to discharge        Co-evaluation               AM-PAC PT "6 Clicks" Daily Activity  Outcome Measure Difficulty turning over in bed (including adjusting bedclothes, sheets and blankets)?: None Difficulty moving from lying on back to sitting on the side of the bed? : None Difficulty sitting down on and standing up from a chair with arms (e.g., wheelchair, bedside commode, etc,.)?: Unable Help needed moving to and from a bed to chair (including a wheelchair)?: None Help needed walking in hospital room?: A Little Help needed climbing 3-5 steps with a railing? : A Little 6 Click Score: 19    End of Session Equipment Utilized During Treatment: Gait belt Activity Tolerance: Patient tolerated treatment well Patient left: in chair;with call bell/phone within reach Nurse Communication: Mobility status PT Visit Diagnosis: Other abnormalities of gait and mobility (R26.89)    Time: 7253-6644 PT Time Calculation (min) (ACUTE ONLY): 22 min   Charges:   PT Evaluation $PT Eval Moderate Complexity: [address] [place], [place] [phone]   Alessandra Bevels Page Lancon 01/30/2018, 10:11 AM

## 2018-02-02 ENCOUNTER — Encounter (HOSPITAL_COMMUNITY): Payer: Self-pay | Admitting: Orthopaedic Surgery

## 2019-05-14 IMAGING — CT CT KNEE*R* W/O CM
1 series · 12 of 14 positions shown, 15 images · non-contrast
Comparison: None.

CLINICAL DATA: The patient suffered a right tibial plateau fracture
in a fall yesterday.

EXAM:
CT OF THE RIGHT KNEE WITHOUT CONTRAST
TECHNIQUE: Multidetector CT imaging of the right knee was performed according
to the standard protocol. Multiplanar CT image reconstructions were
also generated.

[Series 4: knee soft tissue · axial · 0.32mm/px · z∈[-259,-130]mm · 12 of 51 slices shown, 15 images]
[im 4/51  soft-tissue]
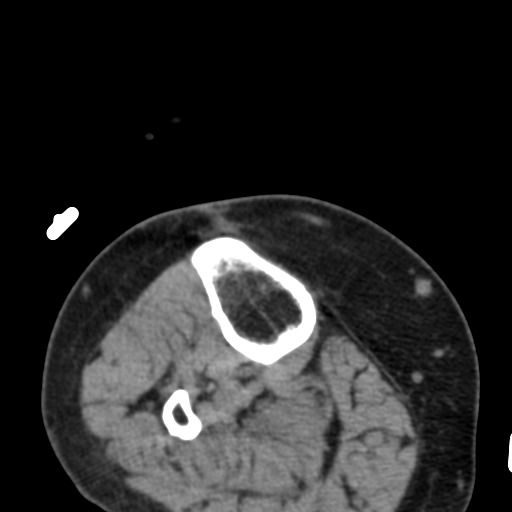
[im 4/51  bone]
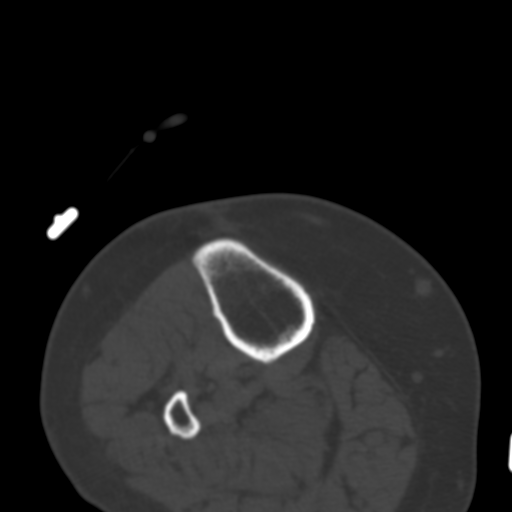
[im 8/51  bone]
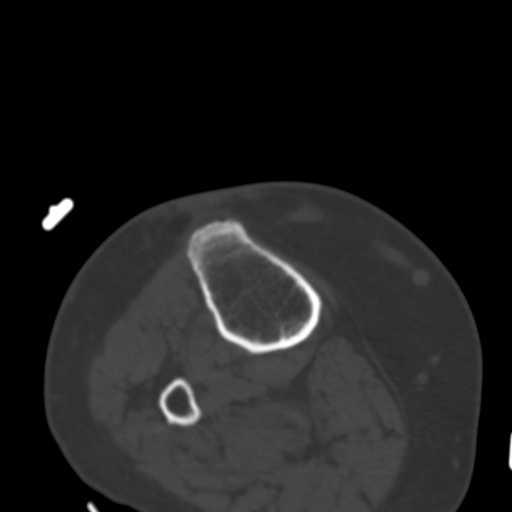
[im 12/51  bone]
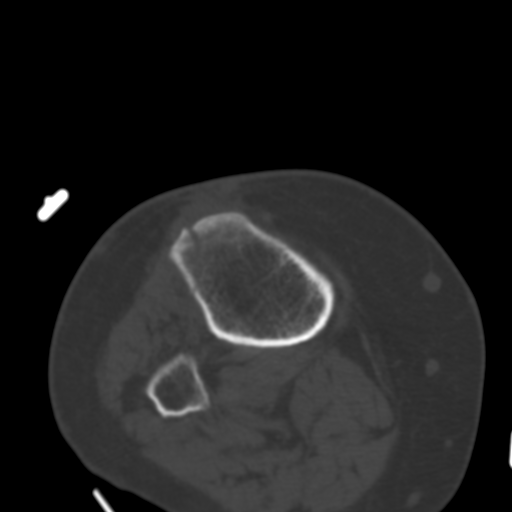
[im 16/51  bone]
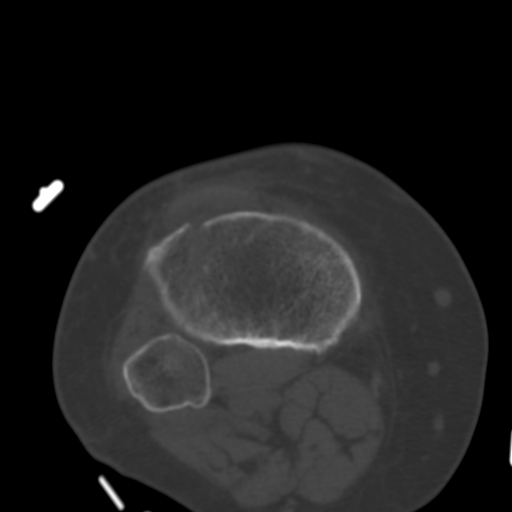
[im 20/51  soft-tissue]
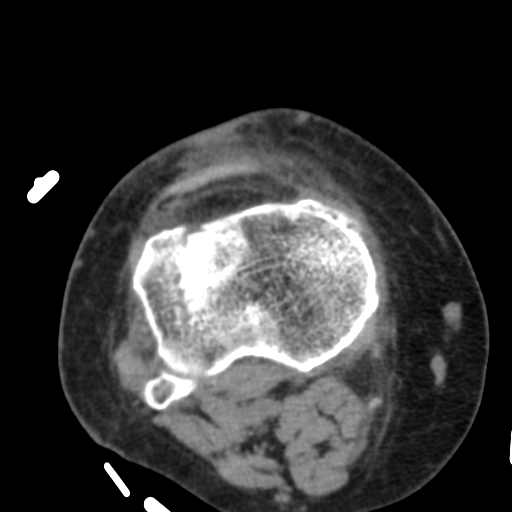
[im 20/51  bone]
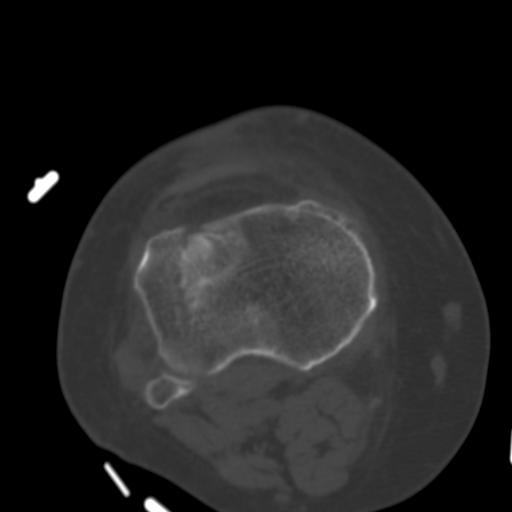
[im 24/51  bone]
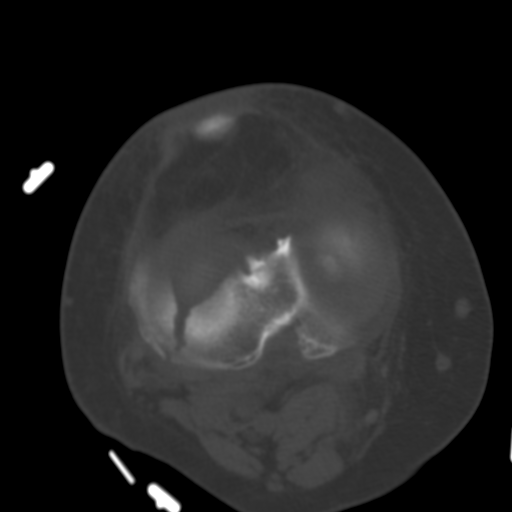
[im 27/51  bone]
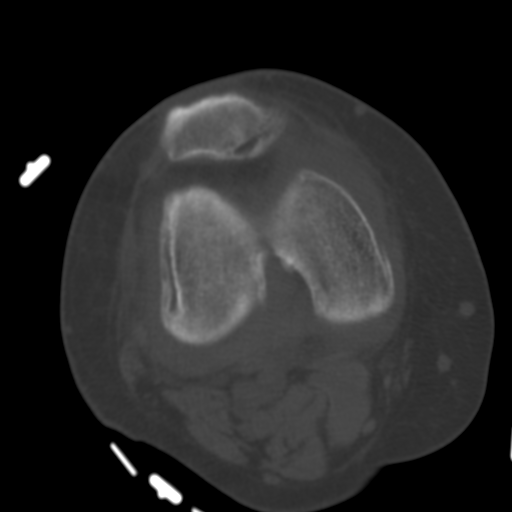
[im 31/51  bone]
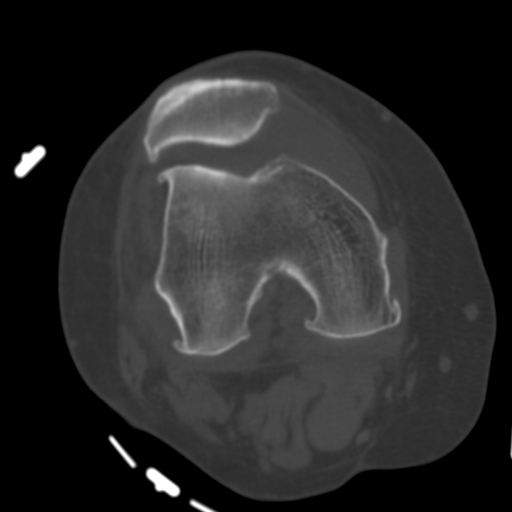
[im 35/51  soft-tissue]
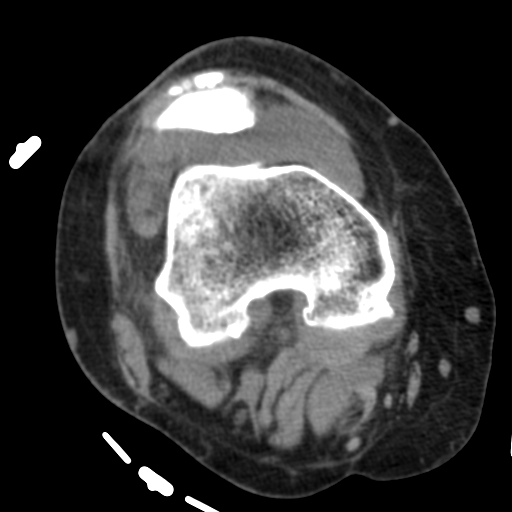
[im 35/51  bone]
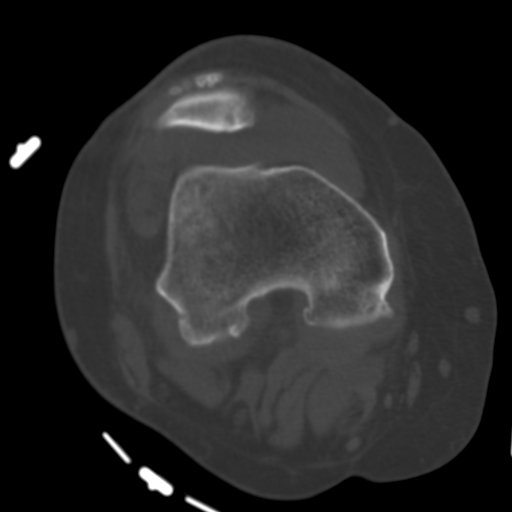
[im 39/51  bone]
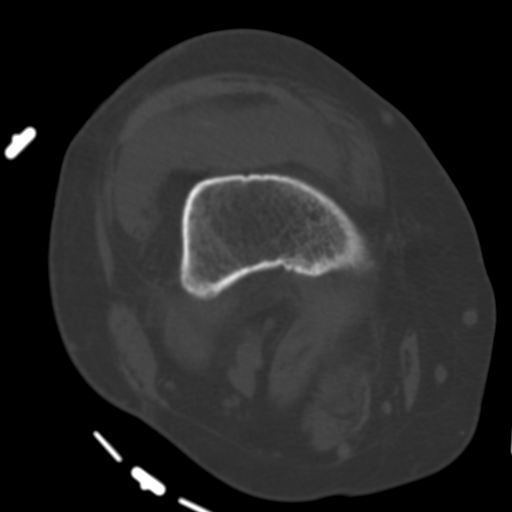
[im 43/51  bone]
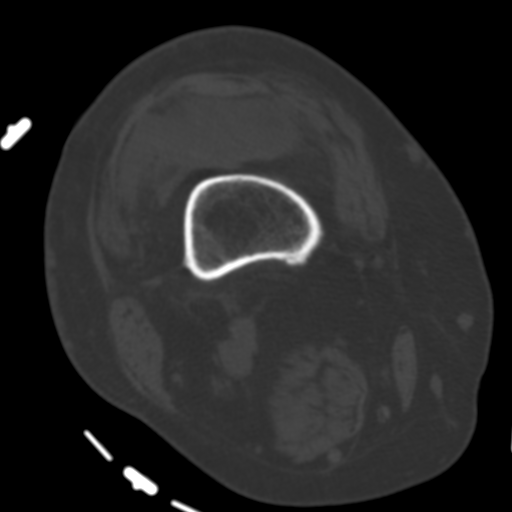
[im 47/51  bone]
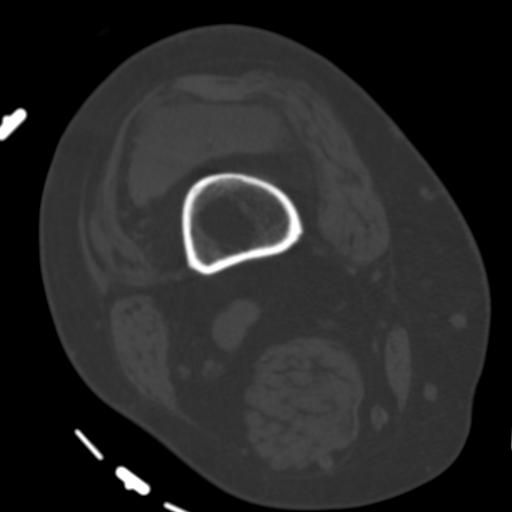

[12 of 14 positions shown; findings below may reference images not displayed]

FINDINGS: Bones/Joint/Cartilage

The patient has an acute fracture, mildly comminuted of the medial
tibial plateau. Area of involvement at the articular surface
measures 1.9 cm AP by 1.5 cm transverse. There is inferior
angulation of the articular surface of up to 17 degrees anteriorly
and depression of up to 0.6 cm. The fracture extends inferiorly
through the medial metaphysis approximately 3.5 cm below the
articular surface. 0.5 cm of the periphery of the articular surface
of the medial plateau is not depressed but is peripherally displaced
0.4 cm. No other fracture is identified.

There is no focal bony lesion. There is osteophytosis about all 3
compartments of the knee consistent with moderate degenerative
change.

Ligaments

Suboptimally assessed by CT. The cruciate and collateral ligaments
appear intact. The menisci have a normal CT appearance.

Muscles and Tendons

Intact and normal in appearance.

Soft tissues

Moderate joint effusion is seen and contains some hemorrhage.
IMPRESSION: Mildly comminuted medial tibial plateau fracture as described above
with up to 0.6 cm of depression. No other fracture is identified.

Mild to moderate osteoarthritis about all 3 compartments of the
knee.
